# Patient Record
Sex: Male | Born: 1955 | Race: Black or African American | Hispanic: No | Marital: Married | Smoking: Former smoker
Health system: Southern US, Community
[De-identification: ages and names within clinical notes are randomized; demographics above are authoritative.]

## PROBLEM LIST (undated history)

## (undated) DIAGNOSIS — N400 Enlarged prostate without lower urinary tract symptoms: Secondary | ICD-10-CM

## (undated) DIAGNOSIS — Z789 Other specified health status: Secondary | ICD-10-CM

## (undated) DIAGNOSIS — E785 Hyperlipidemia, unspecified: Secondary | ICD-10-CM

## (undated) DIAGNOSIS — M109 Gout, unspecified: Secondary | ICD-10-CM

## (undated) HISTORY — DX: Benign prostatic hyperplasia without lower urinary tract symptoms: N40.0

## (undated) HISTORY — DX: Gout, unspecified: M10.9

## (undated) HISTORY — DX: Hyperlipidemia, unspecified: E78.5

## (undated) HISTORY — PX: CIRCUMCISION: SUR203

---

## 2001-06-29 ENCOUNTER — Encounter: Payer: Self-pay | Admitting: *Deleted

## 2001-06-29 ENCOUNTER — Emergency Department (HOSPITAL_COMMUNITY): Admission: EM | Admit: 2001-06-29 | Discharge: 2001-06-29 | Payer: Self-pay | Admitting: *Deleted

## 2002-10-03 ENCOUNTER — Encounter: Payer: Self-pay | Admitting: Family Medicine

## 2002-10-03 ENCOUNTER — Ambulatory Visit (HOSPITAL_COMMUNITY): Admission: RE | Admit: 2002-10-03 | Discharge: 2002-10-03 | Payer: Self-pay | Admitting: Family Medicine

## 2006-05-25 ENCOUNTER — Ambulatory Visit (HOSPITAL_COMMUNITY): Admission: RE | Admit: 2006-05-25 | Discharge: 2006-05-25 | Payer: Self-pay | Admitting: Family Medicine

## 2006-06-07 ENCOUNTER — Encounter (INDEPENDENT_AMBULATORY_CARE_PROVIDER_SITE_OTHER): Payer: Self-pay | Admitting: Specialist

## 2006-06-07 ENCOUNTER — Ambulatory Visit: Payer: Self-pay | Admitting: Internal Medicine

## 2006-06-07 ENCOUNTER — Ambulatory Visit (HOSPITAL_COMMUNITY): Admission: RE | Admit: 2006-06-07 | Discharge: 2006-06-07 | Payer: Self-pay | Admitting: Internal Medicine

## 2007-11-21 ENCOUNTER — Emergency Department (HOSPITAL_COMMUNITY): Admission: EM | Admit: 2007-11-21 | Discharge: 2007-11-21 | Payer: Self-pay | Admitting: Emergency Medicine

## 2009-01-14 ENCOUNTER — Emergency Department (HOSPITAL_COMMUNITY): Admission: EM | Admit: 2009-01-14 | Discharge: 2009-01-14 | Payer: Self-pay | Admitting: Emergency Medicine

## 2009-07-17 ENCOUNTER — Emergency Department (HOSPITAL_COMMUNITY): Admission: EM | Admit: 2009-07-17 | Discharge: 2009-07-17 | Payer: Self-pay | Admitting: Emergency Medicine

## 2009-07-17 ENCOUNTER — Ambulatory Visit: Payer: Self-pay | Admitting: Orthopedic Surgery

## 2009-07-17 DIAGNOSIS — M7020 Olecranon bursitis, unspecified elbow: Secondary | ICD-10-CM

## 2009-08-05 ENCOUNTER — Ambulatory Visit: Payer: Self-pay | Admitting: Orthopedic Surgery

## 2010-03-12 IMAGING — CR DG SHOULDER 2+V*L*
3 series · 3 of 3 positions shown · non-contrast
Comparison: None

CLINICAL DATA: Pain

LEFT SHOULDER - 2+ VIEW

[view not recorded (1 of 3)]
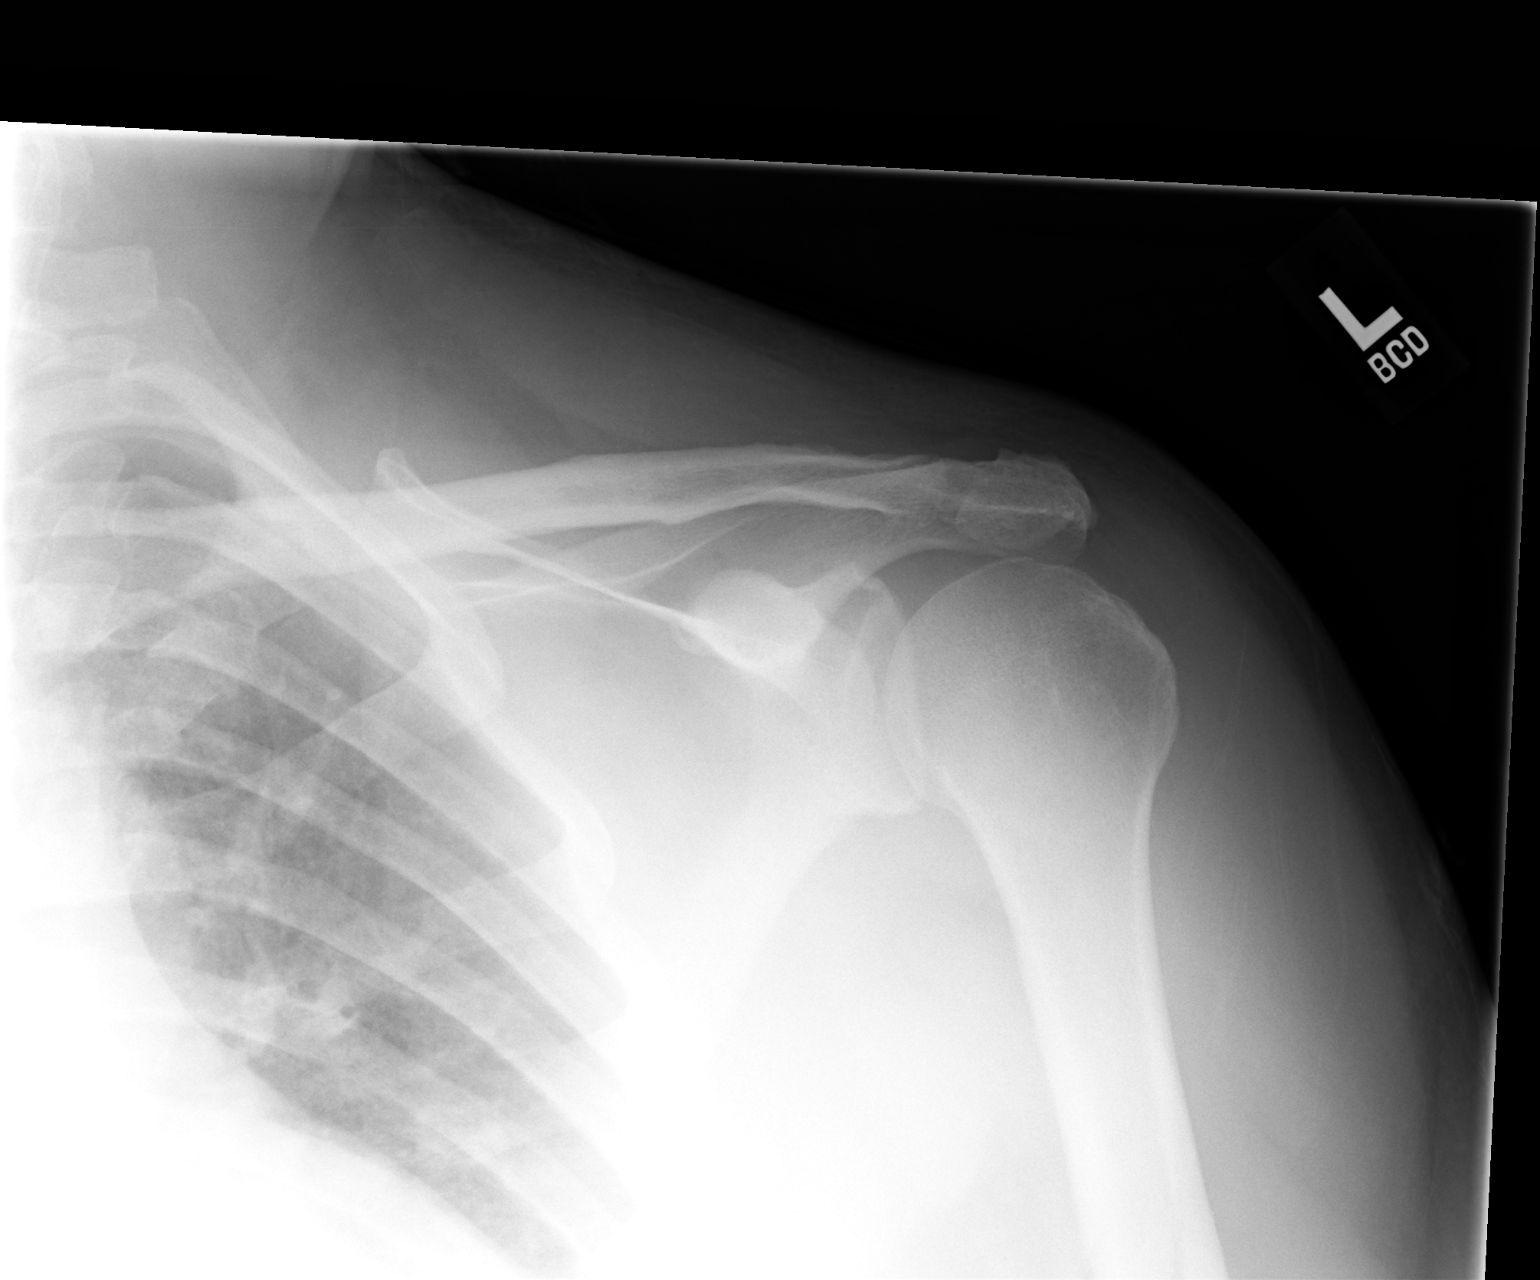

[view not recorded (2 of 3)]
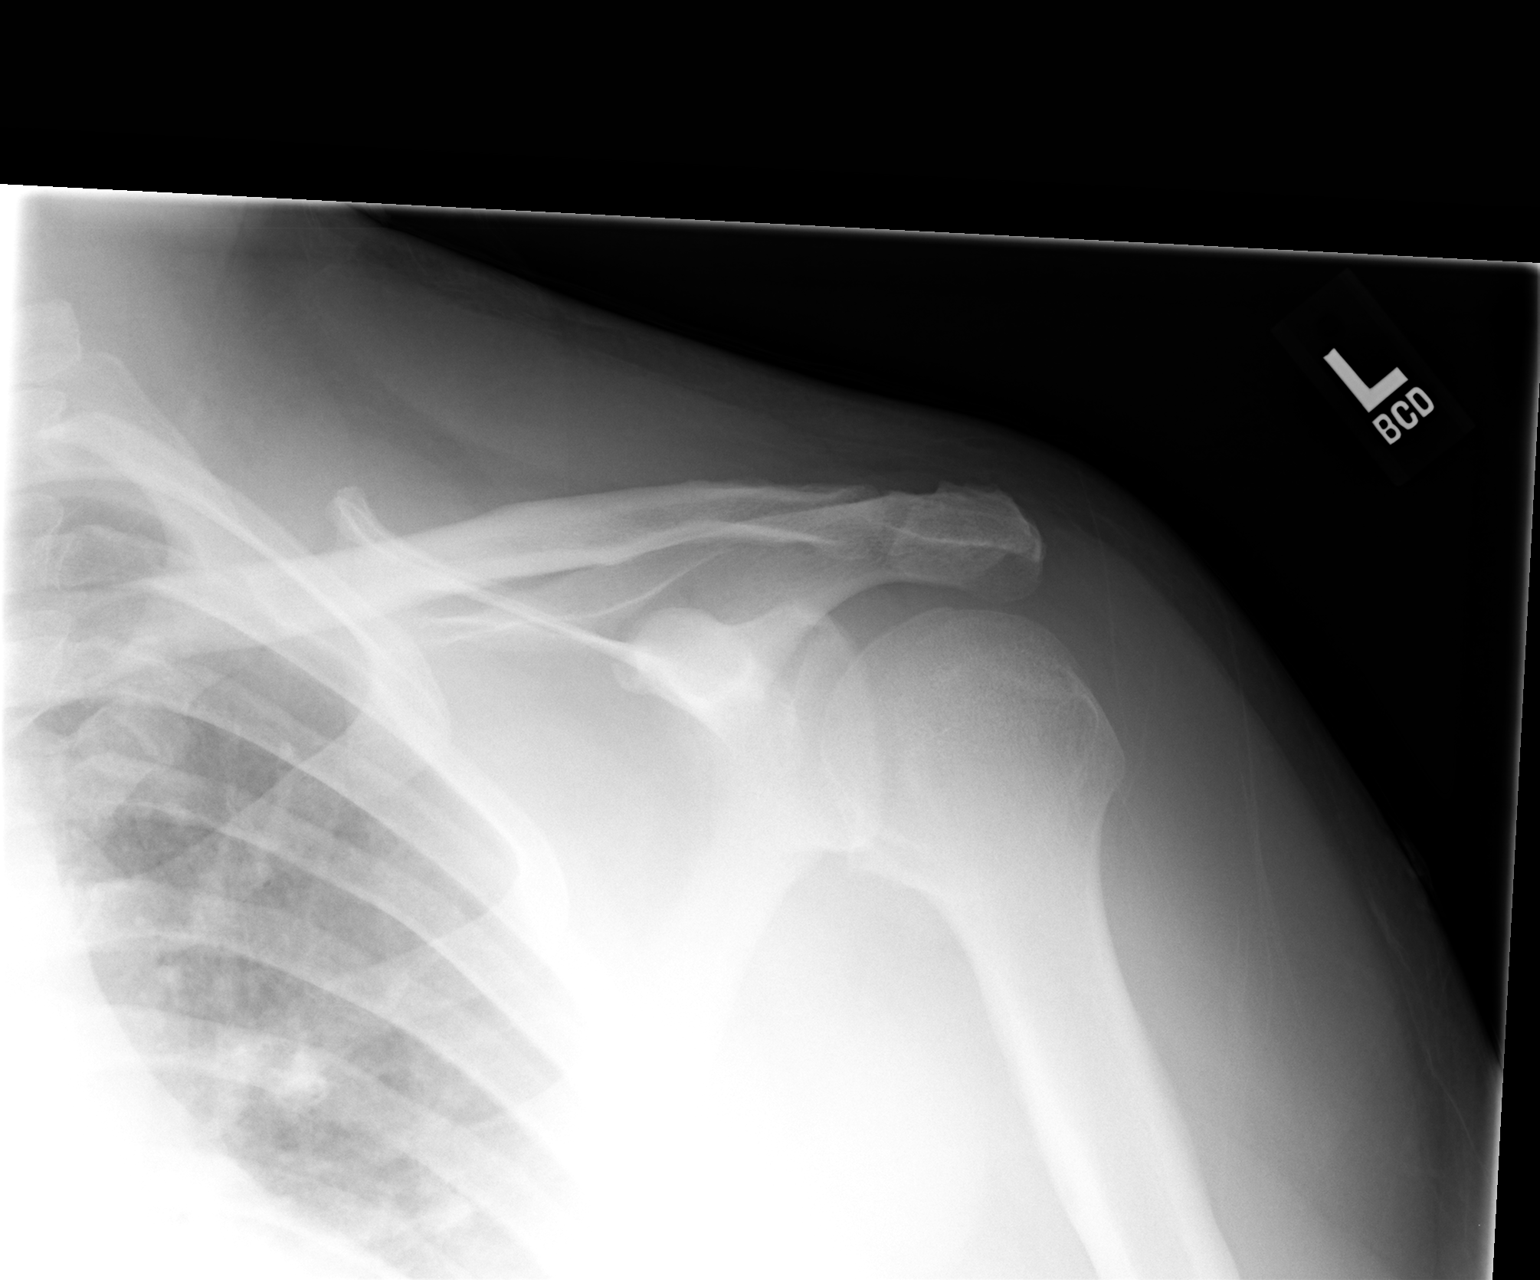

[view not recorded (3 of 3)]
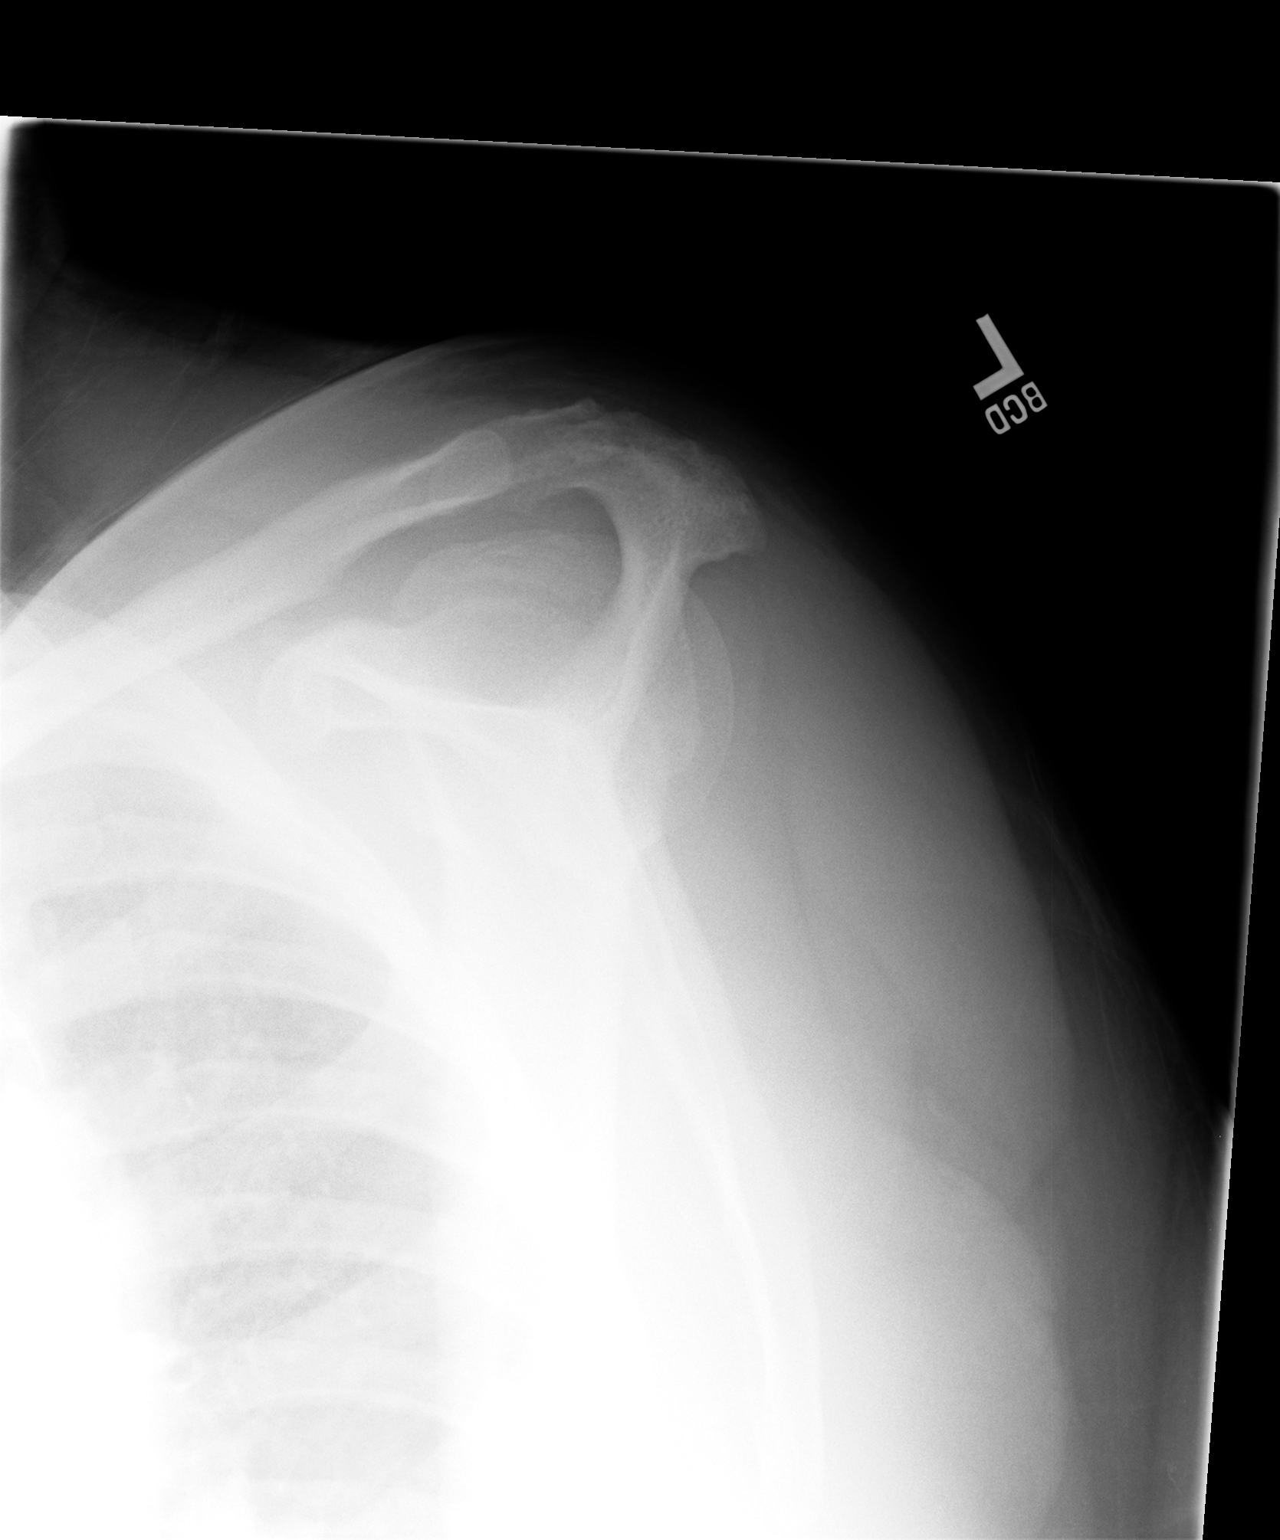

[3 of 3 positions shown; findings below may reference images not displayed]

FINDINGS: There is no evidence of fracture or dislocation.  There
is no evidence of arthropathy or other focal bone abnormality.
Soft tissues are unremarkable.
IMPRESSION: No acute findings.

## 2010-04-26 ENCOUNTER — Emergency Department (HOSPITAL_COMMUNITY): Admission: EM | Admit: 2010-04-26 | Discharge: 2010-04-26 | Payer: Self-pay | Admitting: Emergency Medicine

## 2011-01-15 LAB — POCT I-STAT, CHEM 8
Calcium, Ion: 1.11 mmol/L — ABNORMAL LOW (ref 1.12–1.32)
Chloride: 105 mEq/L (ref 96–112)
Glucose, Bld: 128 mg/dL — ABNORMAL HIGH (ref 70–99)
HCT: 48 % (ref 39.0–52.0)
Hemoglobin: 16.3 g/dL (ref 13.0–17.0)

## 2011-01-15 LAB — POCT CARDIAC MARKERS
CKMB, poc: 3.7 ng/mL (ref 1.0–8.0)
Myoglobin, poc: 168 ng/mL (ref 12–200)

## 2011-02-27 NOTE — Op Note (Signed)
NAME:  Larry Fleming, Larry Fleming                 ACCOUNT NO.:  [id number]   MEDICAL RECORD NO.:  [id number]          PATIENT TYPE:  AMB   LOCATION:  DAY                           FACILITY:  [facility]   PHYSICIAN:  R. Roetta Sessions, M.D. DATE OF BIRTH:  [birth year]   DATE OF PROCEDURE:  06/07/2006  DATE OF DISCHARGE:                                 OPERATIVE REPORT   PROCEDURE:  Colonoscopy with cold biopsy and polyp removal.   INDICATIONS:  The patient is a 55 year old gentleman sent over for  susceptibility for colorectal cancer screening.  He is devoid of any urinary  tract symptoms.  He has never had his lower GI tract imaged.  Family history  is positive for colorectal cancer in his brother who was diagnosed at age  48.  Colonoscopy at this time being done is a high risk screening maneuver.  This approach has been discussed with the patient at length.  Potential  risks, benefits, and alternatives have been reviewed and questions answered.  Please see documentation in the medical record.   DESCRIPTION OF PROCEDURE:  Oxygen saturation, blood pressure, pulse and  respiration were monitored throughout the entire procedure.  Conscious  sedation with Versed 3 mg IV and Demerol 75 mg IV in divided doses.  The  instrument was the Olympus video chip system.   FINDINGS:  Digital rectal exam revealed no abnormalities.   ENDOSCOPIC FINDINGS:  Prep was good.   Colon:  Colonic mucosa was surveyed from the rectosigmoid junction through  the left, transverse,  right colon to the area of the appendiceal orifice,  ileocecal valve and cecum.  These structures were well seen and  photographed.  From this level, the scope was slowly withdrawn.  All  previously mentioned mucosal surfaces were again seen.  The patient had a 3  mm polyp at the cecum which was cold biopsied/removed.  The remainder of the  colonic mucosa appeared normal.  The rectal mucosa was well seen. A thorough  examination of the rectal mucosa  including retroflexed view in the anal  verge revealed no abnormalities.  The patient tolerated the procedure well  and was reactive after endoscopy.   IMPRESSION:  1. Normal rectum.  2. Diminutive polyp at the cecum, cold biopsied/removed.  3. The remainder of the colonic mucosa appeared normal.   RECOMMENDATIONS:  1. Followup on pathology.  2. Further recommendations to follow.      Jonathon Bellows, M.D.  Electronically Signed     RMR/MEDQ  D:  06/07/2006  T:  06/07/2006  Job:  [id number]   cc:   Kirk Ruths, M.D.  Fax: [phone]

## 2013-07-12 ENCOUNTER — Ambulatory Visit (INDEPENDENT_AMBULATORY_CARE_PROVIDER_SITE_OTHER): Payer: Managed Care, Other (non HMO) | Admitting: Family Medicine

## 2013-07-12 ENCOUNTER — Encounter: Payer: Self-pay | Admitting: Family Medicine

## 2013-07-12 VITALS — BP 128/70 | HR 78 | Temp 97.1°F | Resp 14 | Ht 69.0 in | Wt 256.0 lb

## 2013-07-12 DIAGNOSIS — Z0000 Encounter for general adult medical examination without abnormal findings: Secondary | ICD-10-CM

## 2013-07-12 DIAGNOSIS — N529 Male erectile dysfunction, unspecified: Secondary | ICD-10-CM

## 2013-07-12 DIAGNOSIS — Z13220 Encounter for screening for lipoid disorders: Secondary | ICD-10-CM

## 2013-07-12 DIAGNOSIS — N4889 Other specified disorders of penis: Secondary | ICD-10-CM

## 2013-07-12 DIAGNOSIS — R300 Dysuria: Secondary | ICD-10-CM

## 2013-07-12 DIAGNOSIS — R7301 Impaired fasting glucose: Secondary | ICD-10-CM

## 2013-07-12 DIAGNOSIS — E669 Obesity, unspecified: Secondary | ICD-10-CM

## 2013-07-12 DIAGNOSIS — N400 Enlarged prostate without lower urinary tract symptoms: Secondary | ICD-10-CM

## 2013-07-12 DIAGNOSIS — B353 Tinea pedis: Secondary | ICD-10-CM

## 2013-07-12 DIAGNOSIS — Z125 Encounter for screening for malignant neoplasm of prostate: Secondary | ICD-10-CM

## 2013-07-12 LAB — CBC WITH DIFFERENTIAL/PLATELET
Eosinophils Absolute: 0.2 10*3/uL (ref 0.0–0.7)
Eosinophils Relative: 2 % (ref 0–5)
HCT: 45.4 % (ref 39.0–52.0)
Hemoglobin: 15.4 g/dL (ref 13.0–17.0)
Lymphs Abs: 2 10*3/uL (ref 0.7–4.0)
MCH: 25.8 pg — ABNORMAL LOW (ref 26.0–34.0)
MCV: 76 fL — ABNORMAL LOW (ref 78.0–100.0)
Monocytes Absolute: 0.8 10*3/uL (ref 0.1–1.0)
Monocytes Relative: 8 % (ref 3–12)
RBC: 5.97 MIL/uL — ABNORMAL HIGH (ref 4.22–5.81)

## 2013-07-12 LAB — URINALYSIS, ROUTINE W REFLEX MICROSCOPIC
Bilirubin Urine: NEGATIVE
Protein, ur: NEGATIVE mg/dL
Urobilinogen, UA: 0.2 mg/dL (ref 0.0–1.0)

## 2013-07-12 LAB — URINALYSIS, MICROSCOPIC ONLY
Casts: NONE SEEN
Crystals: NONE SEEN

## 2013-07-12 MED ORDER — CLOTRIMAZOLE 1 % EX CREA: TOPICAL_CREAM | Freq: Two times a day (BID) | CUTANEOUS | Status: DC

## 2013-07-12 MED ORDER — TAMSULOSIN HCL 0.4 MG PO CAPS: 0.4000 mg | ORAL_CAPSULE | Freq: Every day | ORAL | Status: DC

## 2013-07-12 NOTE — Assessment & Plan Note (Signed)
Urinalysis shows mild hematuria with only a few RBC. I think is due to some irritation around the urethral area and to to the DRE. We will send off a culture as he is on the study drug and I will fax that to the attention of Alinda Sierras [phone] He did have some sperm at his urinalysis was taken after his prostate exam

## 2013-07-12 NOTE — Assessment & Plan Note (Signed)
Problem Flomax will also check PSA and testosterone

## 2013-07-12 NOTE — Assessment & Plan Note (Signed)
We'll check testosterone level. He may be a good candidate for Viagra or Cialis

## 2013-07-12 NOTE — Assessment & Plan Note (Signed)
Topical clotrimazole 

## 2013-07-12 NOTE — Progress Notes (Signed)
  Subjective:    Patient ID: Larry Fleming, male    DOB: 09/23/1956, 57 y.o.   MRN: [id number]  HPI  Patient here to establish care and for complete physical. Previous PCP [facility]. He is currently in a trial for gout. The study is been completed in [facility]. The study drug is called Lesinurad it is a blind study. He is also on allopurinol 300 mg daily. He was evaluated today secondary to some dysuria and penile irritation while on the medication. A message was sent for him to have a urinalysis done at our office to look for infection.  Elevated cholesterol-he's had a few elevated cholesterol numbers on his study drug labs which he has with him today. However at the last in September show a cholesterol of 171 and triglycerides 239 which is within their normal limits of their labs. I do not see an LDL or HDL. He does have an elevated fasting glucose of 109 his renal function was slightly elevated at 1.24 on the last set of labs.   His concerns today are difficulty with his urinary stream and some nocturia for the past couple months. He often will have to get up 3-4 times a night and has a very weak stream or he dribbles. He also has experienced impotence as well.  TDAP 3 years ago Colonoscopy UTD had at [facility] Review of Systems  GEN- denies fatigue, fever, weight loss,weakness, recent illness HEENT- denies eye drainage, change in vision, nasal discharge, CVS- denies chest pain, palpitations RESP- denies SOB, cough, wheeze ABD- denies N/V, change in stools, abd pain GU- denies dysuria, hematuria, +dribbling, incontinence MSK- denies joint pain, muscle aches, injury Neuro- denies headache, dizziness, syncope, seizure activity      Objective:   Physical Exam GEN- NAD, alert and oriented x3 HEENT- PERRL, EOMI, non injected sclera, pink conjunctiva, MMM, oropharynx clear Neck- Supple, no LAD CVS- RRR, no murmur RESP-CTAB ABD-NABS,soft,NT,ND, no CVA tenderness GU-  coronal urethra,+erythema, no discharge, no blood seen,NT  Rectum- normal tone, Soft brown stool, enlarged prostate no nodules felt EXT- No edema Pulses- Radial, DP- 2+ Psych - normal affect and mood Skin- maceration between most toes, mild erythema, sweaty odor       Assessment & Plan:  CPE- new patient CPE, reviewed labs, will return for fasting Lipid profile  Check PSA- discussed risk factors, Flu shot given

## 2013-07-12 NOTE — Patient Instructions (Addendum)
Use cream for feet as directed- atheletes foot I will send records of the urine to [facility] Flu shot in We will call with other lab results - and further instructions Release of information from [facility] Work on the weight ,limit fried food and fast food, no soda  Flomax for BPH F/U Monday with Lab- fasting  F/U 6 months

## 2013-07-13 ENCOUNTER — Encounter: Payer: Self-pay | Admitting: Family Medicine

## 2013-07-13 ENCOUNTER — Telehealth: Payer: Self-pay | Admitting: Family Medicine

## 2013-07-13 LAB — BASIC METABOLIC PANEL
BUN: 19 mg/dL (ref 6–23)
Chloride: 102 mEq/L (ref 96–112)
Creat: 1.23 mg/dL (ref 0.50–1.35)

## 2013-07-13 LAB — URINE CULTURE: Colony Count: NO GROWTH

## 2013-07-13 NOTE — Telephone Encounter (Signed)
Sent fax over to [facility] medical records for request of colonoscopy report, Trish for MR said that the copy service will look over them adn send report by mail.

## 2013-07-17 ENCOUNTER — Other Ambulatory Visit: Payer: Managed Care, Other (non HMO)

## 2013-07-17 DIAGNOSIS — Z13220 Encounter for screening for lipoid disorders: Secondary | ICD-10-CM

## 2013-07-17 LAB — LIPID PANEL: Cholesterol: 184 mg/dL (ref 0–200)

## 2013-07-24 ENCOUNTER — Ambulatory Visit (INDEPENDENT_AMBULATORY_CARE_PROVIDER_SITE_OTHER): Payer: Managed Care, Other (non HMO) | Admitting: Family Medicine

## 2013-07-24 ENCOUNTER — Encounter: Payer: Self-pay | Admitting: Family Medicine

## 2013-07-24 VITALS — BP 126/70 | HR 72 | Temp 98.1°F | Resp 18 | Ht 68.5 in | Wt 257.0 lb

## 2013-07-24 DIAGNOSIS — E782 Mixed hyperlipidemia: Secondary | ICD-10-CM

## 2013-07-24 DIAGNOSIS — N529 Male erectile dysfunction, unspecified: Secondary | ICD-10-CM

## 2013-07-24 DIAGNOSIS — E291 Testicular hypofunction: Secondary | ICD-10-CM

## 2013-07-24 DIAGNOSIS — R7989 Other specified abnormal findings of blood chemistry: Secondary | ICD-10-CM | POA: Insufficient documentation

## 2013-07-24 MED ORDER — TESTOSTERONE 20.25 MG/ACT (1.62%) TD GEL: 40.5000 mg | Freq: Every day | TRANSDERMAL | Status: DC

## 2013-07-24 NOTE — Progress Notes (Signed)
  Subjective:    Patient ID: Larry Fleming, male    DOB: 1956/04/24, 57 y.o.   MRN: [id number]  HPI  Pt here to f/u labs. Seen for establishing visit. Labs reviewed FLP- elevated TG 198, LDL elevated 121 ED- PSA normal, testosterone low at 224, difficulty getting and maintaining erections,decreased libido. Note penile irritation has cleared up with lotrimin cream CBC/BMET unremarkable, A1C normal   Review of Systems - per above     Objective:   Physical Exam  GEN-NAD,alert and oriented x 3      Assessment & Plan:

## 2013-07-24 NOTE — Assessment & Plan Note (Signed)
Discussed dietary changes needed, recommend he start fish oil 1 tablet BID

## 2013-07-24 NOTE — Patient Instructions (Signed)
Get fish oil ( Omega 3)  1 tablet over the counter  Androgel for hormone therapy Decreased fried food, potatoes , bread, increase water  Change f/u to 3 months

## 2013-07-24 NOTE — Assessment & Plan Note (Signed)
2/2 low testosterone

## 2013-07-24 NOTE — Assessment & Plan Note (Signed)
Discussed pros and cons of testosterone therapy Pt wishes to proceed with androgel Will start low dose and titrate

## 2013-10-24 ENCOUNTER — Ambulatory Visit (INDEPENDENT_AMBULATORY_CARE_PROVIDER_SITE_OTHER): Payer: Managed Care, Other (non HMO) | Admitting: Family Medicine

## 2013-10-24 ENCOUNTER — Encounter: Payer: Self-pay | Admitting: Family Medicine

## 2013-10-24 VITALS — BP 122/76 | HR 78 | Temp 98.1°F | Resp 16 | Ht 69.0 in | Wt 246.0 lb

## 2013-10-24 DIAGNOSIS — E291 Testicular hypofunction: Secondary | ICD-10-CM

## 2013-10-24 DIAGNOSIS — N400 Enlarged prostate without lower urinary tract symptoms: Secondary | ICD-10-CM

## 2013-10-24 DIAGNOSIS — R7989 Other specified abnormal findings of blood chemistry: Secondary | ICD-10-CM

## 2013-10-24 DIAGNOSIS — E782 Mixed hyperlipidemia: Secondary | ICD-10-CM

## 2013-10-24 DIAGNOSIS — E669 Obesity, unspecified: Secondary | ICD-10-CM

## 2013-10-24 LAB — BASIC METABOLIC PANEL
BUN: 13 mg/dL (ref 6–23)
CALCIUM: 9.1 mg/dL (ref 8.4–10.5)
CO2: 22 meq/L (ref 19–32)
Chloride: 107 mEq/L (ref 96–112)
Creat: 1.36 mg/dL — ABNORMAL HIGH (ref 0.50–1.35)
Glucose, Bld: 111 mg/dL — ABNORMAL HIGH (ref 70–99)
POTASSIUM: 4.4 meq/L (ref 3.5–5.3)
SODIUM: 138 meq/L (ref 135–145)

## 2013-10-24 LAB — LIPID PANEL
CHOL/HDL RATIO: 5.3 ratio
Cholesterol: 164 mg/dL (ref 0–200)
HDL: 31 mg/dL — ABNORMAL LOW (ref >39)
LDL Cholesterol: 104 mg/dL — ABNORMAL HIGH (ref 0–99)
Triglycerides: 144 mg/dL (ref <150)
VLDL: 29 mg/dL (ref 0–40)

## 2013-10-24 LAB — TESTOSTERONE: TESTOSTERONE: 189 ng/dL — AB (ref 300–890)

## 2013-10-24 NOTE — Progress Notes (Signed)
   Subjective:    Patient ID: Larry Fleming, male    DOB: Nov 24, 1955, 58 y.o.   MRN: [id number]  HPI  Patient follow up chronic medical problems. He has no specific concerns. He is still in a study-but is now on a new drug. His libido has improved with the use of testosterone. He does still have some difficulty with erections but it is improved. He has changed his diet and has lost 10 pounds since her last visit here. Review of Systems GEN- denies fatigue, fever, weight loss,weakness, recent illness HEENT- denies eye drainage, change in vision, nasal discharge, CVS- denies chest pain, palpitations RESP- denies SOB, cough, wheeze ABD- denies N/V, change in stools, abd pain GU- denies dysuria, hematuria, dribbling, incontinence MSK- denies joint pain, muscle aches, injury Neuro- denies headache, dizziness, syncope, seizure activity       Objective:   Physical Exam GEN- NAD, alert and oriented x3 HEENT- PERRL, EOMI, non injected sclera, pink conjunctiva, MMM, oropharynx clear CVS- RRR, no murmur RESP-CTAB EXT- No edema Pulses- Radial 2+        Assessment & Plan:

## 2013-10-24 NOTE — Assessment & Plan Note (Signed)
improved

## 2013-10-24 NOTE — Patient Instructions (Addendum)
Release  Of records [facility]- [address]- need last OV  We will send letter with normal labs Continue current meds F/U 4 months

## 2013-10-24 NOTE — Assessment & Plan Note (Signed)
Check Testosterone levels  Continue therapy

## 2013-10-24 NOTE — Assessment & Plan Note (Signed)
10 lb weight loss noted.  

## 2013-10-24 NOTE — Assessment & Plan Note (Signed)
Recheck FLP today, weight loss and dietary changes noted

## 2013-12-27 ENCOUNTER — Other Ambulatory Visit: Payer: Self-pay | Admitting: Family Medicine

## 2013-12-28 NOTE — Telephone Encounter (Signed)
Refill appropriate and filled per protocol. 

## 2014-01-10 ENCOUNTER — Encounter: Payer: Self-pay | Admitting: Family Medicine

## 2014-01-10 ENCOUNTER — Ambulatory Visit (INDEPENDENT_AMBULATORY_CARE_PROVIDER_SITE_OTHER): Payer: Managed Care, Other (non HMO) | Admitting: Family Medicine

## 2014-01-10 VITALS — BP 138/72 | HR 64 | Temp 97.5°F | Resp 16 | Ht 69.0 in | Wt 241.0 lb

## 2014-01-10 DIAGNOSIS — M1A00X1 Idiopathic chronic gout, unspecified site, with tophus (tophi): Secondary | ICD-10-CM

## 2014-01-10 DIAGNOSIS — N4889 Other specified disorders of penis: Secondary | ICD-10-CM

## 2014-01-10 DIAGNOSIS — M1A9XX1 Chronic gout, unspecified, with tophus (tophi): Secondary | ICD-10-CM | POA: Insufficient documentation

## 2014-01-10 DIAGNOSIS — N400 Enlarged prostate without lower urinary tract symptoms: Secondary | ICD-10-CM

## 2014-01-10 MED ORDER — DOXAZOSIN MESYLATE ER 4 MG PO TB24: 4.0000 mg | ORAL_TABLET | Freq: Every day | ORAL | Status: DC

## 2014-01-10 MED ORDER — CLOTRIMAZOLE-BETAMETHASONE 1-0.05 % EX CREA: 1.0000 "application " | TOPICAL_CREAM | Freq: Two times a day (BID) | CUTANEOUS | Status: DC

## 2014-01-10 MED ORDER — ALLOPURINOL 300 MG PO TABS: 300.0000 mg | ORAL_TABLET | Freq: Every day | ORAL | Status: DC

## 2014-01-10 NOTE — Assessment & Plan Note (Signed)
Try him on Cardura instead and see if he does better with this in the Flomax

## 2014-01-10 NOTE — Progress Notes (Signed)
Patient ID: Larry Fleming, male   DOB: [birth year], 58 y.o.   MRN: [id number]   Subjective:    Patient ID: Larry Fleming, male    DOB: [birth year], 58 y.o.   MRN: [id number]  Patient presents for 6 month F/U and L elbow pain/ swelling  patient here follow chronic medical problems. He is no longer in the gout trial. He does complain of swelling on his left elbow which occurred a few weeks ago the swelling is now gone down to was also red and tender to touch. He's been off his allopurinol for about 6 weeks as he was no longer getting this from the trial. He does have culture seen at home but did not take it. He states he has never had a gout flare in his elbow before.  Continues to use testosterone for erectile dysfunction and low testosterone. He also has BPH and the Flomax helps some but he still has difficulty urinating.  Also continues to have irritation on the outside of his penis he states he's pulling back the skin and cleaning as normal the redness is now gone but the skin is now cracked and itching.    Review Of Systems:  GEN- denies fatigue, fever, weight loss,weakness, recent illness HEENT- denies eye drainage, change in vision, nasal discharge, CVS- denies chest pain, palpitations RESP- denies SOB, cough, wheeze ABD- denies N/V, change in stools, abd pain GU- denies dysuria, hematuria, +dribbling, incontinence MSK- denies joint pain, muscle aches, injury Neuro- denies headache, dizziness, syncope, seizure activity       Objective:    BP 138/72  Pulse 64  Temp(Src) 97.5 F (36.4 C) (Oral)  Resp 16  Ht 5\' 9"  (1.753 m)  Wt 241 lb (109.317 kg)  BMI 35.57 kg/m2 GEN- NAD, alert and oriented x3 HEENT- PERRL, EOMI, non injected sclera, pink conjunctiva, MMM, oropharynx clear CVS- RRR, no murmur RESP-CTAB ABD-NABS,soft,NT,ND, no CVA tenderness GU- coronal urethra,+mild erythema,crack in skin noted, hypopigmentation around head of penis,no discharge, no blood seen,NT  EXT- No  edema Pulses- Radial 2+          Assessment & Plan:      Problem List Items Addressed This Visit   None      Note: This dictation was prepared with Dragon dictation along with smaller phrase technology. Any transcriptional errors that result from this process are unintentional.

## 2014-01-10 NOTE — Patient Instructions (Signed)
Restart allopurinol Stop the flomax Start cardura once a day for your prostate Call if you have another flare of the gout  CHANGE FOLLOW-UP TO 3 Months

## 2014-01-10 NOTE — Assessment & Plan Note (Signed)
Trial of Lotrisone if this does not improve it I will send him to dermatology or urology

## 2014-01-10 NOTE — Assessment & Plan Note (Signed)
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 I will get him back on his allopurinol will hold on the colchicine at this time. I advised him to keep a hold of this as we may need to be a bit of a flare

## 2014-02-01 ENCOUNTER — Other Ambulatory Visit: Payer: Self-pay | Admitting: Family Medicine

## 2014-02-02 NOTE — Telephone Encounter (Signed)
Ok to refill??  Last office visit 01/10/2014.  Last refill 07/24/2013.

## 2014-02-02 NOTE — Telephone Encounter (Signed)
Okay to refill, give 3 refills 

## 2014-02-02 NOTE — Telephone Encounter (Signed)
Prescription faxed

## 2014-02-21 ENCOUNTER — Ambulatory Visit (INDEPENDENT_AMBULATORY_CARE_PROVIDER_SITE_OTHER): Payer: Managed Care, Other (non HMO) | Admitting: Family Medicine

## 2014-02-21 ENCOUNTER — Encounter: Payer: Self-pay | Admitting: Family Medicine

## 2014-02-21 VITALS — BP 134/86 | HR 64 | Temp 97.9°F | Resp 16 | Ht 69.0 in | Wt 245.0 lb

## 2014-02-21 DIAGNOSIS — L089 Local infection of the skin and subcutaneous tissue, unspecified: Secondary | ICD-10-CM

## 2014-02-21 DIAGNOSIS — S91319A Laceration without foreign body, unspecified foot, initial encounter: Secondary | ICD-10-CM

## 2014-02-21 DIAGNOSIS — S91309A Unspecified open wound, unspecified foot, initial encounter: Secondary | ICD-10-CM

## 2014-02-21 DIAGNOSIS — N400 Enlarged prostate without lower urinary tract symptoms: Secondary | ICD-10-CM

## 2014-02-21 DIAGNOSIS — L909 Atrophic disorder of skin, unspecified: Secondary | ICD-10-CM

## 2014-02-21 DIAGNOSIS — N289 Disorder of kidney and ureter, unspecified: Secondary | ICD-10-CM

## 2014-02-21 DIAGNOSIS — E291 Testicular hypofunction: Secondary | ICD-10-CM

## 2014-02-21 DIAGNOSIS — L918 Other hypertrophic disorders of the skin: Secondary | ICD-10-CM | POA: Insufficient documentation

## 2014-02-21 DIAGNOSIS — R7989 Other specified abnormal findings of blood chemistry: Secondary | ICD-10-CM

## 2014-02-21 DIAGNOSIS — L919 Hypertrophic disorder of the skin, unspecified: Secondary | ICD-10-CM

## 2014-02-21 LAB — BASIC METABOLIC PANEL WITH GFR
BUN: 13 mg/dL (ref 6–23)
CHLORIDE: 105 meq/L (ref 96–112)
CO2: 21 meq/L (ref 19–32)
Calcium: 8.9 mg/dL (ref 8.4–10.5)
Creat: 1.25 mg/dL (ref 0.50–1.35)
GFR, EST NON AFRICAN AMERICAN: 63 mL/min
GFR, Est African American: 73 mL/min
Glucose, Bld: 101 mg/dL — ABNORMAL HIGH (ref 70–99)
POTASSIUM: 4.5 meq/L (ref 3.5–5.3)
Sodium: 136 mEq/L (ref 135–145)

## 2014-02-21 LAB — TESTOSTERONE: Testosterone: 172 ng/dL — ABNORMAL LOW (ref 300–890)

## 2014-02-21 MED ORDER — TERAZOSIN HCL 2 MG PO CAPS: 2.0000 mg | ORAL_CAPSULE | Freq: Every day | ORAL | Status: DC

## 2014-02-21 NOTE — Assessment & Plan Note (Signed)
Note medication is too expensive we'll switch him to terazosin

## 2014-02-21 NOTE — Assessment & Plan Note (Signed)
Recheck renal function with GFR

## 2014-02-21 NOTE — Assessment & Plan Note (Signed)
Status post removal of benign lesion, this was not sent to pathology

## 2014-02-21 NOTE — Patient Instructions (Signed)
New prostate medication sent  We will call with labs  Keep the bandage on for 24 hours Triple antibiotic ointment to both the skin tag and foot CHANGE FOLLOW-UP FROM July TO September

## 2014-02-21 NOTE — Progress Notes (Signed)
Patient ID: Larry Fleming, male   DOB: 01/20/1956, 58 y.o.   MRN: [id number]   Subjective:    Patient ID: Larry Fleming, male    DOB: 01/20/1956, 58 y.o.   MRN: [id number]  Patient presents for F/U and R foot, smallest toe nail bleeding  patient here for interim followup. He complains of his skin tag have on his right side is been irritating him states it is tender to touch and feels like it is always getting caught on things. He would like to have this remove.  3 days ago he was getting out of bed and stump his right little toe he noticed some bleeding afterwards and was concerned that the nail was coming up some. He still has some pain in the toe. He does not have any pain while walking   Review Of Systems:  GEN- denies fatigue, fever, weight loss,weakness, recent illness HEENT- denies eye drainage, change in vision, nasal discharge, CVS- denies chest pain, palpitations RESP- denies SOB, cough, wheeze ABD- denies N/V, change in stools, abd pain GU- denies dysuria, hematuria, dribbling, incontinence MSK- denies joint pain, muscle aches, injury Neuro- denies headache, dizziness, syncope, seizure activity       Objective:    BP 134/86  Pulse 64  Temp(Src) 97.9 F (36.6 C) (Oral)  Resp 16  Ht 5\' 9"  (1.753 m)  Wt 245 lb (111.131 kg)  BMI 36.16 kg/m2 GEN- NAD, alert and oriented x3 Skin- Right mid side- irritated skin tag Ext- Right foot- 5th digit, small laceration above nail, dry blood noted, nail slightly avulsed at tip otherwise connected to nail bed, normal ROM of 5th digit, no swelling noted  Procedure- Skin Tag Removal Procedure explained to patient questions answered benefits and risks discussed verbal consent obtained. Antiseptic-Betadine Anesthesia-lidocaine 1% with epi Tags clipped at base with scissors Minimal blood loss, silver nitrate touched to lesion on neck and axilla due to persistent oozing Patient tolerated procedure well Bandage applied           Assessment & Plan:      Problem List Items Addressed This Visit   Renal insufficiency   Relevant Orders      BASIC METABOLIC PANEL WITH GFR   Low testosterone   Relevant Orders      Testosterone   Laceration of foot - Primary   Inflamed skin tag      Note: This dictation was prepared with Dragon dictation along with smaller phrase technology. Any transcriptional errors that result from this process are unintentional.

## 2014-02-21 NOTE — Assessment & Plan Note (Signed)
Small laceration right above the nail I think this is causing the bleeding. He is a very minimal avulsion therefore I would not try to remove the nail. Triple antibiotic ointment applied after cleaning the bedside. His tetanus booster is up-to-date

## 2014-02-21 NOTE — Assessment & Plan Note (Signed)
-

## 2014-02-23 ENCOUNTER — Telehealth: Payer: Self-pay | Admitting: Family Medicine

## 2014-02-23 DIAGNOSIS — N529 Male erectile dysfunction, unspecified: Secondary | ICD-10-CM

## 2014-02-23 DIAGNOSIS — R7989 Other specified abnormal findings of blood chemistry: Secondary | ICD-10-CM

## 2014-02-23 NOTE — Telephone Encounter (Signed)
Referral initiated.  Have left message for patient to call back.

## 2014-02-23 NOTE — Telephone Encounter (Signed)
Message copied by Donne AnonPLUMMER, Leandria Thier M on Fri Feb 23, 2014 12:50 PM ------      Message from: Milinda AntisURHAM, KAWANTA F      Created: Fri Feb 23, 2014  8:37 AM       Please let pt know kidney function is back to normal      His testosterone however continues to trend to low despite treatment, I recommend he see urology to see other options for treatment              Send Referral- Urology,  Dx low testosterone, ED   ( Fax with last 3 testosterone levels and his PSA from 2014) ------

## 2014-02-26 ENCOUNTER — Other Ambulatory Visit: Payer: Self-pay | Admitting: *Deleted

## 2014-02-26 ENCOUNTER — Encounter: Payer: Self-pay | Admitting: *Deleted

## 2014-02-26 DIAGNOSIS — E291 Testicular hypofunction: Secondary | ICD-10-CM

## 2014-02-27 NOTE — Telephone Encounter (Signed)
Spoke to wife.  They are aware of referral. Appt still pending

## 2014-03-22 ENCOUNTER — Telehealth: Payer: Self-pay | Admitting: *Deleted

## 2014-03-22 NOTE — Telephone Encounter (Signed)
Pt called stating that he needs new Rx sent to pharmacy ANDROGEL PUMP 20.25 MG/ACT (1.62%) GEL he says provider had told him to use 3 pumps instead of 2 and pharmacy says a new script needs to be sent stating 3 pumps.

## 2014-03-23 MED ORDER — TESTOSTERONE 20.25 MG/ACT (1.62%) TD GEL
TRANSDERMAL | Status: DC
Start: 2014-03-23 — End: 2017-06-29

## 2014-03-23 NOTE — Telephone Encounter (Signed)
Send with new dose of 3 pumps, also make sure he has his urology appt for his Low T, referral was sent in May

## 2014-03-23 NOTE — Telephone Encounter (Signed)
Prescription sent to pharmacy.

## 2014-03-23 NOTE — Telephone Encounter (Signed)
Ok to refill with increased dosage??  Last office visit 02/21/2014.  Last refill 02/02/2014.

## 2014-04-11 ENCOUNTER — Ambulatory Visit: Payer: Managed Care, Other (non HMO) | Admitting: Family Medicine

## 2014-04-17 ENCOUNTER — Ambulatory Visit: Payer: Managed Care, Other (non HMO) | Admitting: Family Medicine

## 2014-05-06 ENCOUNTER — Encounter (HOSPITAL_COMMUNITY): Payer: Self-pay | Admitting: Emergency Medicine

## 2014-05-06 ENCOUNTER — Emergency Department (HOSPITAL_COMMUNITY)
Admission: EM | Admit: 2014-05-06 | Discharge: 2014-05-06 | Disposition: A | Discharge status: Home / Self Care | Payer: Managed Care, Other (non HMO) | Attending: Emergency Medicine | Admitting: Emergency Medicine

## 2014-05-06 ENCOUNTER — Emergency Department (HOSPITAL_COMMUNITY): Payer: Managed Care, Other (non HMO)

## 2014-05-06 DIAGNOSIS — S5002XA Contusion of left elbow, initial encounter: Secondary | ICD-10-CM

## 2014-05-06 DIAGNOSIS — Z87891 Personal history of nicotine dependence: Secondary | ICD-10-CM | POA: Insufficient documentation

## 2014-05-06 DIAGNOSIS — S199XXA Unspecified injury of neck, initial encounter: Secondary | ICD-10-CM

## 2014-05-06 DIAGNOSIS — Y92410 Unspecified street and highway as the place of occurrence of the external cause: Secondary | ICD-10-CM | POA: Insufficient documentation

## 2014-05-06 DIAGNOSIS — Z791 Long term (current) use of non-steroidal anti-inflammatories (NSAID): Secondary | ICD-10-CM | POA: Insufficient documentation

## 2014-05-06 DIAGNOSIS — S139XXA Sprain of joints and ligaments of unspecified parts of neck, initial encounter: Secondary | ICD-10-CM | POA: Insufficient documentation

## 2014-05-06 DIAGNOSIS — Y9389 Activity, other specified: Secondary | ICD-10-CM | POA: Insufficient documentation

## 2014-05-06 DIAGNOSIS — Z79899 Other long term (current) drug therapy: Secondary | ICD-10-CM | POA: Insufficient documentation

## 2014-05-06 DIAGNOSIS — Z7982 Long term (current) use of aspirin: Secondary | ICD-10-CM | POA: Insufficient documentation

## 2014-05-06 DIAGNOSIS — M109 Gout, unspecified: Secondary | ICD-10-CM | POA: Insufficient documentation

## 2014-05-06 DIAGNOSIS — S161XXA Strain of muscle, fascia and tendon at neck level, initial encounter: Secondary | ICD-10-CM

## 2014-05-06 DIAGNOSIS — S5000XA Contusion of unspecified elbow, initial encounter: Secondary | ICD-10-CM | POA: Insufficient documentation

## 2014-05-06 DIAGNOSIS — S0993XA Unspecified injury of face, initial encounter: Secondary | ICD-10-CM | POA: Insufficient documentation

## 2014-05-06 MED ORDER — METHOCARBAMOL 500 MG PO TABS: 500.0000 mg | ORAL_TABLET | Freq: Three times a day (TID) | ORAL | Status: DC

## 2014-05-06 MED ORDER — TRAMADOL HCL 50 MG PO TABS: 50.0000 mg | ORAL_TABLET | Freq: Four times a day (QID) | ORAL | Status: DC | PRN
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
## 2014-05-06 NOTE — ED Notes (Signed)
C-collar placed on patient.
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
## 2014-05-06 NOTE — Discharge Instructions (Signed)
Cervical Sprain A cervical sprain is when the tissues (ligaments) that hold the neck bones in place stretch or tear. HOME CARE   Put ice on the injured area.  Put ice in a plastic bag.  Place a towel between your skin and the bag.  Leave the ice on for 15-20 minutes, 3-4 times a day.  You may have been given a collar to wear. This collar keeps your neck from moving while you heal.  Do not take the collar off unless told by your doctor.  If you have Hol[place] hair, keep it outside of the collar.  Ask your doctor before changing the position of your collar. You may need to change its position over time to make it more comfortable.  If you are allowed to take off the collar for cleaning or bathing, follow your doctor's instructions on how to do it safely.  Keep your collar clean by wiping it with mild soap and water. Dry it completely. If the collar has removable pads, remove them every 1-2 days to hand wash them with soap and water. Allow them to air dry. They should be dry before you wear them in the collar.  Do not drive while wearing the collar.  Only take medicine as told by your doctor.  Keep all doctor visits as told.  Keep all physical therapy visits as told.  Adjust your work station so that you have good posture while you work.  Avoid positions and activities that make your problems worse.  Warm up and stretch before being active. GET HELP IF:  Your pain is not controlled with medicine.  You cannot take less pain medicine over time as planned.  Your activity level does not improve as expected. GET HELP RIGHT AWAY IF:   You are bleeding.  Your stomach is upset.  You have an allergic reaction to your medicine.  You develop new problems that you cannot explain.  You lose feeling (become numb) or you cannot move any part of your body (paralysis).  You have tingling or weakness in any part of your body.  Your symptoms get worse. Symptoms include:  Pain,  soreness, stiffness, puffiness (swelling), or a burning feeling in your neck.  Pain when your neck is touched.  Shoulder or upper back pain.  Limited ability to move your neck.  Headache.  Dizziness.  Your hands or arms feel week, lose feeling, or tingle.  Muscle spasms.  Difficulty swallowing or chewing. MAKE SURE YOU:   Understand these instructions.  Will watch your condition.  Will get help right away if you are not doing well or get worse. Document Released: 03/16/2008 Document Revised: 05/31/2013 Document Reviewed: 04/05/2013 [facility] Patient Information 2015 [place], [place]. This information is not intended to replace advice given to you by your health care provider. Make sure you discuss any questions you have with your health care provider.  Contusion A contusion is a deep bruise. Contusions happen when an injury causes bleeding under the skin. Signs of bruising include pain, puffiness (swelling), and discolored skin. The contusion may turn blue, purple, or yellow. HOME CARE   Put ice on the injured area.  Put ice in a plastic bag.  Place a towel between your skin and the bag.  Leave the ice on for 15-20 minutes, 03-04 times a day.  Only take medicine as told by your doctor.  Rest the injured area.  If possible, raise (elevate) the injured area to lessen puffiness. GET HELP RIGHT AWAY IF:  You have more bruising or puffiness.  You have pain that is getting worse.  Your puffiness or pain is not helped by medicine. MAKE SURE YOU:   Understand these instructions.  Will watch your condition.  Will get help right away if you are not doing well or get worse. Document Released: 03/16/2008 Document Revised: 12/21/2011 Document Reviewed: 08/03/2011 [facility] Patient Information 2015 [place], [place]. This information is not intended to replace advice given to you by your health care provider. Make sure you discuss any questions you have with your health care  provider.  Elbow Contusion An elbow contusion is a deep bruise of the elbow. Contusions are the result of an injury that caused bleeding under the skin. The contusion may turn blue, purple, or yellow. Minor injuries will give you a painless contusion, but more severe contusions may stay painful and swollen for a few weeks.  CAUSES  An elbow contusion comes from a direct force to that area, such as falling on the elbow. SYMPTOMS   Swelling and redness of the elbow.  Bruising of the elbow area.  Tenderness or soreness of the elbow. DIAGNOSIS  You will have a physical exam and will be asked about your history. You may need an X-ray of your elbow to look for a broken bone (fracture).  TREATMENT  A sling or splint may be needed to support your injury. Resting, elevating, and applying cold compresses to the elbow area are often the best treatments for an elbow contusion. Over-the-counter medicines may also be recommended for pain control. HOME CARE INSTRUCTIONS   Put ice on the injured area.  Put ice in a plastic bag.  Place a towel between your skin and the bag.  Leave the ice on for 15-20 minutes, 03-04 times a day.  Only take over-the-counter or prescription medicines for pain, discomfort, or fever as directed by your caregiver.  Rest your injured elbow until the pain and swelling are better.  Elevate your elbow to reduce swelling.  Apply a compression wrap as directed by your caregiver. This can help reduce swelling and motion. You may remove the wrap for sleeping, showers, and baths. If your fingers become numb, cold, or blue, take the wrap off and reapply it more loosely.  Use your elbow only as directed by your caregiver. You may be asked to do range of motion exercises. Do them as directed.  See your caregiver as directed. It is very important to keep all follow-up appointments in order to avoid any Sporrer-term problems with your elbow, including chronic pain or inability to move  your elbow normally. SEEK IMMEDIATE MEDICAL CARE IF:   You have increased redness, swelling, or pain in your elbow.  Your swelling or pain is not relieved with medicines.  You have swelling of the hand and fingers.  You are unable to move your fingers or wrist.  You begin to lose feeling in your hand or fingers.  Your fingers or hand become cold or blue. MAKE SURE YOU:   Understand these instructions.  Will watch your condition.  Will get help right away if you are not doing well or get worse. Document Released: 09/06/2006 Document Revised: 12/21/2011 Document Reviewed: 08/14/2011 [facility] Patient Information 2015 [place], [place]. This information is not intended to replace advice given to you by your health care provider. Make sure you discuss any questions you have with your health care provider.

## 2014-05-09 NOTE — ED Provider Notes (Signed)
Medical screening examination/treatment/procedure(s) were performed by non-physician practitioner and as supervising physician I was immediately available for consultation/collaboration.   EKG Interpretation None       Nayef College, MD 05/09/14 1839 

## 2014-06-27 ENCOUNTER — Ambulatory Visit: Payer: Managed Care, Other (non HMO) | Admitting: Family Medicine

## 2014-08-08 ENCOUNTER — Other Ambulatory Visit: Payer: Self-pay | Admitting: Family Medicine

## 2014-08-20 ENCOUNTER — Other Ambulatory Visit: Payer: Self-pay | Admitting: Family Medicine

## 2014-08-21 NOTE — Telephone Encounter (Signed)
Refill denied 

## 2014-10-13 ENCOUNTER — Other Ambulatory Visit: Payer: Self-pay | Admitting: Family Medicine

## 2014-10-15 NOTE — Telephone Encounter (Signed)
Patient is no longer seen at our clinic.   Refill denied.

## 2014-11-01 ENCOUNTER — Other Ambulatory Visit: Payer: Self-pay | Admitting: Family Medicine

## 2014-11-06 ENCOUNTER — Encounter: Payer: Self-pay | Admitting: Family Medicine

## 2014-12-24 ENCOUNTER — Encounter: Payer: Self-pay | Admitting: Family Medicine

## 2015-05-10 ENCOUNTER — Encounter: Payer: Self-pay | Admitting: Family Medicine

## 2015-05-10 ENCOUNTER — Ambulatory Visit (INDEPENDENT_AMBULATORY_CARE_PROVIDER_SITE_OTHER): Payer: Managed Care, Other (non HMO) | Admitting: Family Medicine

## 2015-05-10 VITALS — BP 122/70 | HR 80 | Temp 97.9°F | Resp 18 | Wt 242.0 lb

## 2015-05-10 DIAGNOSIS — M1000 Idiopathic gout, unspecified site: Secondary | ICD-10-CM | POA: Diagnosis not present

## 2015-05-10 MED ORDER — PREDNISONE 20 MG PO TABS: ORAL_TABLET | ORAL | Status: DC

## 2015-05-10 MED ORDER — METHYLPREDNISOLONE ACETATE 80 MG/ML IJ SUSP
80.0000 mg | Freq: Once | INTRAMUSCULAR | Status: AC
  Administered 2015-05-10: 80 mg via INTRAMUSCULAR

## 2015-05-10 NOTE — Addendum Note (Signed)
Addended by: Elvina Mattes T on: 05/10/2015 03:12 PM   Modules accepted: Orders

## 2015-05-10 NOTE — Progress Notes (Signed)
   Subjective:    Patient ID: Larry Fleming, male    DOB: 1956/07/15, 59 y.o.   MRN: [id number]  HPI Patient is a very pleasant 59 year old African-American male who presents with severe pain in his left wrist. The left wrist is erythematous warm and swollen. He has a history of tophaceous gout. Patient quit taking his allopurinol 8 months ago. He reports severe pain with range of motion in the wrist. He denies any injury to the wrist. He denies any cuts or injections in the wrist. Patient states the pain feels similar to his previous gout exacerbations that he's had Past Medical History  Diagnosis Date  . Gout   . Hyperlipidemia    No past surgical history on file. Current Outpatient Prescriptions on File Prior to Visit  Medication Sig Dispense Refill  . allopurinol (ZYLOPRIM) 300 MG tablet Take 1 tablet (300 mg total) by mouth daily. 30 tablet 6  . aspirin EC 81 MG tablet Take 81 mg by mouth daily.    . methocarbamol (ROBAXIN) 500 MG tablet Take 1 tablet (500 mg total) by mouth 3 (three) times daily. 21 tablet 0  . naproxen sodium (ALEVE) 220 MG tablet Take 220 mg by mouth 2 (two) times daily with a meal.    . tamsulosin (FLOMAX) 0.4 MG CAPS capsule Take 0.4 mg by mouth daily.    . Testosterone (ANDROGEL PUMP) 20.25 MG/ACT (1.62%) GEL PLACE 3 PUMPS ONTO THE SKIN EVERY DAY AS DIRECTED 75 g 2  . Testosterone (ANDROGEL PUMP) 20.25 MG/ACT (1.62%) GEL Place 3 Act onto the skin daily.    . traMADol (ULTRAM) 50 MG tablet Take 1 tablet (50 mg total) by mouth every 6 (six) hours as needed. 15 tablet 0   No current facility-administered medications on file prior to visit.   No Known Allergies History   Social History  . Marital Status: Married    Spouse Name: N/A  . Number of Children: N/A  . Years of Education: N/A   Occupational History  . Not on file.   Social History Main Topics  . Smoking status: Former Smoker -- 1.00 packs/day for 4 years    Types: Cigarettes    Quit date:  02/09/1998  . Smokeless tobacco: Never Used  . Alcohol Use: No  . Drug Use: No  . Sexual Activity: Not on file   Other Topics Concern  . Not on file   Social History Narrative      Review of Systems  All other systems reviewed and are negative.      Objective:   Physical Exam  Cardiovascular: Normal rate, regular rhythm and normal heart sounds.   Pulmonary/Chest: Effort normal and breath sounds normal.  Musculoskeletal:       Left wrist: He exhibits decreased range of motion, tenderness, bony tenderness and swelling.  Skin: Skin is warm. There is erythema.  Vitals reviewed.         Assessment & Plan:  Acute idiopathic gout, unspecified site - Plan: predniSONE (DELTASONE) 20 MG tablet  Patient has a severe gout flare. Begin Depo-Medrol 80 mg IM 1. Then start prednisone taper pack tomorrow. If not getting better significantly over the next 24 hours on the patient reassessed as infection is also on the differential however I believe that this is a gout exacerbation. I'll also recommended the patient follow-up with his primary care physician to discuss resuming allopurinol

## 2015-05-22 ENCOUNTER — Encounter: Payer: Self-pay | Admitting: Family Medicine

## 2015-05-22 ENCOUNTER — Ambulatory Visit (INDEPENDENT_AMBULATORY_CARE_PROVIDER_SITE_OTHER): Payer: Managed Care, Other (non HMO) | Admitting: Family Medicine

## 2015-05-22 VITALS — BP 138/84 | HR 64 | Temp 97.9°F | Resp 12 | Ht 68.0 in | Wt 237.0 lb

## 2015-05-22 DIAGNOSIS — H6122 Impacted cerumen, left ear: Secondary | ICD-10-CM | POA: Diagnosis not present

## 2015-05-22 DIAGNOSIS — M109 Gout, unspecified: Secondary | ICD-10-CM

## 2015-05-22 DIAGNOSIS — E782 Mixed hyperlipidemia: Secondary | ICD-10-CM | POA: Diagnosis not present

## 2015-05-22 DIAGNOSIS — Z0000 Encounter for general adult medical examination without abnormal findings: Secondary | ICD-10-CM | POA: Diagnosis not present

## 2015-05-22 DIAGNOSIS — N528 Other male erectile dysfunction: Secondary | ICD-10-CM | POA: Diagnosis not present

## 2015-05-22 DIAGNOSIS — E669 Obesity, unspecified: Secondary | ICD-10-CM | POA: Diagnosis not present

## 2015-05-22 DIAGNOSIS — M1A9XX1 Chronic gout, unspecified, with tophus (tophi): Secondary | ICD-10-CM

## 2015-05-22 LAB — LIPID PANEL
Cholesterol: 154 mg/dL (ref 125–200)
HDL: 31 mg/dL — AB (ref >=40)
LDL Cholesterol: 99 mg/dL (ref <130)
TRIGLYCERIDES: 121 mg/dL (ref <150)
Total CHOL/HDL Ratio: 5 Ratio (ref <=5.0)
VLDL: 24 mg/dL (ref <30)

## 2015-05-22 LAB — COMPREHENSIVE METABOLIC PANEL
ALBUMIN: 3.7 g/dL (ref 3.6–5.1)
ALK PHOS: 58 U/L (ref 40–115)
ALT: 17 U/L (ref 9–46)
AST: 16 U/L (ref 10–35)
BUN: 13 mg/dL (ref 7–25)
CO2: 23 mmol/L (ref 20–31)
Calcium: 9 mg/dL (ref 8.6–10.3)
Chloride: 104 mmol/L (ref 98–110)
Creat: 1.2 mg/dL (ref 0.70–1.33)
GLUCOSE: 108 mg/dL — AB (ref 70–99)
POTASSIUM: 4.3 mmol/L (ref 3.5–5.3)
Sodium: 138 mmol/L (ref 135–146)
TOTAL PROTEIN: 6.2 g/dL (ref 6.1–8.1)
Total Bilirubin: 0.4 mg/dL (ref 0.2–1.2)

## 2015-05-22 LAB — CBC WITH DIFFERENTIAL/PLATELET
BASOS PCT: 0 % (ref 0–1)
Basophils Absolute: 0 10*3/uL (ref 0.0–0.1)
Eosinophils Absolute: 0.2 10*3/uL (ref 0.0–0.7)
Eosinophils Relative: 2 % (ref 0–5)
HCT: 51 % (ref 39.0–52.0)
Hemoglobin: 16.8 g/dL (ref 13.0–17.0)
Lymphocytes Relative: 18 % (ref 12–46)
Lymphs Abs: 1.7 10*3/uL (ref 0.7–4.0)
MCH: 26.3 pg (ref 26.0–34.0)
MCHC: 32.9 g/dL (ref 30.0–36.0)
MCV: 79.9 fL (ref 78.0–100.0)
MONO ABS: 0.9 10*3/uL (ref 0.1–1.0)
MPV: 9.4 fL (ref 8.6–12.4)
Monocytes Relative: 10 % (ref 3–12)
NEUTROS ABS: 6.4 10*3/uL (ref 1.7–7.7)
NEUTROS PCT: 70 % (ref 43–77)
PLATELETS: 281 10*3/uL (ref 150–400)
RBC: 6.38 MIL/uL — ABNORMAL HIGH (ref 4.22–5.81)
RDW: 18.1 % — ABNORMAL HIGH (ref 11.5–15.5)
WBC: 9.2 10*3/uL (ref 4.0–10.5)

## 2015-05-22 LAB — URIC ACID: URIC ACID, SERUM: 9.3 mg/dL — AB (ref 4.0–7.8)

## 2015-05-22 MED ORDER — SILDENAFIL CITRATE 100 MG PO TABS: 50.0000 mg | ORAL_TABLET | Freq: Every day | ORAL | Status: DC | PRN

## 2015-05-22 MED ORDER — ALLOPURINOL 100 MG PO TABS: 200.0000 mg | ORAL_TABLET | Freq: Every day | ORAL | Status: DC

## 2015-05-22 NOTE — Assessment & Plan Note (Signed)
Restart allopurinol, start with  for 4 weeks, then increase to  once a day ,

## 2015-05-22 NOTE — Progress Notes (Signed)
Patient ID: Larry Fleming, male   DOB: 16-Apr-1956, 59 y.o.   MRN: [id number]   Subjective:    Patient ID: Larry Fleming, male    DOB: Mar 02, 1956, 59 y.o.   MRN: [id number]  Patient presents for CPE  patient here to for complete physical exam. He has no particular concerns. He states that he feels well. He would like to restart his allopurinol he ran out of his prescription after he did not follow-up at our office. He was treated for gout flare about 2 weeks ago which that is now resolved. He is still being followed by urology for BPH as well as low testosterone. He has been donating blood echo due to elevated hematocrit which has been working. His colonoscopy is up-to-date. His immunizations are up-to-date. He still works full-time without any difficulties.  Had hearing exam at work, noted to have wax impaction in left ear, needs cleaned out, hearing will be rechecked at work  Review Of Systems:  GEN- denies fatigue, fever, weight loss,weakness, recent illness HEENT- denies eye drainage, change in vision, nasal discharge, CVS- denies chest pain, palpitations RESP- denies SOB, cough, wheeze ABD- denies N/V, change in stools, abd pain GU- denies dysuria, hematuria, dribbling, incontinence MSK- denies joint pain, muscle aches, injury Neuro- denies headache, dizziness, syncope, seizure activity       Objective:    BP 138/84 mmHg  Pulse 64  Temp(Src) 97.9 F (36.6 C) (Oral)  Resp 12  Ht  (1.727 m)  Wt 237 lb (107.502 kg)  BMI 36.04 kg/m2 GEN- NAD, alert and oriented x3 HEENT- PERRL, EOMI, non injected sclera, pink conjunctiva, MMM, oropharynx clear, TM and canal clear right side, impacted canal on left side  Neck- Supple, no thyromegaly CVS- RRR, no murmur RESP-CTAB ABD-NABS,soft,NT,ND Ext- gouty tophi bilat elbows  EXT- No edema Pulses- Radial  2+        Assessment & Plan:      Problem List Items Addressed This Visit    Obesity   Hyperlipidemia, mixed    Check  labs, continue to work on weight loss and healthy eating      Relevant Medications   sildenafil (VIAGRA) 100 MG tablet   Other Relevant Orders   Lipid panel   Gouty tophi of joint    Restart allopurinol, start with  for 4 weeks, then increase to  once a day ,      Relevant Medications   allopurinol (ZYLOPRIM) 100 MG tablet   Other Relevant Orders   Uric Acid   Erectile dysfunction    Given sample of viagra , script sent for  - he can take 1/2 tablet       Other Visit Diagnoses    Routine general medical examination at a health care facility    -  Primary    CPE done, shots UTD, fasting labs, colonoscopy due in 2017    Relevant Orders    CBC with Differential/Platelet    Comprehensive metabolic panel    Cerumen impaction, left        s/p irrigation of ear       Note: This dictation was prepared with Dragon dictation along with smaller phrase technology. Any transcriptional errors that result from this process are unintentional.

## 2015-05-22 NOTE — Patient Instructions (Signed)
Restart allopurinol- start with 1 tablet, for 1 month then move to 2 tablets I recommend eye visit once a year I recommend dental visit every 6 months Goal is to  Exercise 30 minutes 5 days a week We will send a letter with lab results  F/U 6 months

## 2015-05-22 NOTE — Assessment & Plan Note (Signed)
Check labs, continue to work on weight loss and healthy eating

## 2015-05-22 NOTE — Assessment & Plan Note (Signed)
Given sample of viagra , script sent for  - he can take 1/2 tablet

## 2015-11-22 ENCOUNTER — Ambulatory Visit (INDEPENDENT_AMBULATORY_CARE_PROVIDER_SITE_OTHER): Payer: Managed Care, Other (non HMO) | Admitting: Family Medicine

## 2015-11-22 ENCOUNTER — Encounter: Payer: Self-pay | Admitting: Family Medicine

## 2015-11-22 VITALS — BP 138/64 | HR 72 | Temp 99.7°F | Resp 16 | Ht 68.0 in | Wt 246.0 lb

## 2015-11-22 DIAGNOSIS — R7301 Impaired fasting glucose: Secondary | ICD-10-CM

## 2015-11-22 DIAGNOSIS — E669 Obesity, unspecified: Secondary | ICD-10-CM | POA: Diagnosis not present

## 2015-11-22 DIAGNOSIS — M19042 Primary osteoarthritis, left hand: Secondary | ICD-10-CM | POA: Diagnosis not present

## 2015-11-22 DIAGNOSIS — E782 Mixed hyperlipidemia: Secondary | ICD-10-CM | POA: Diagnosis not present

## 2015-11-22 DIAGNOSIS — M1A9XX1 Chronic gout, unspecified, with tophus (tophi): Secondary | ICD-10-CM

## 2015-11-22 DIAGNOSIS — M109 Gout, unspecified: Secondary | ICD-10-CM

## 2015-11-22 DIAGNOSIS — M1812 Unilateral primary osteoarthritis of first carpometacarpal joint, left hand: Secondary | ICD-10-CM

## 2015-11-22 LAB — CBC WITH DIFFERENTIAL/PLATELET
BASOS PCT: 1 % (ref 0–1)
Basophils Absolute: 0.1 10*3/uL (ref 0.0–0.1)
Eosinophils Absolute: 0.2 10*3/uL (ref 0.0–0.7)
Eosinophils Relative: 3 % (ref 0–5)
HCT: 51.3 % (ref 39.0–52.0)
Hemoglobin: 16.6 g/dL (ref 13.0–17.0)
Lymphocytes Relative: 23 % (ref 12–46)
Lymphs Abs: 1.5 10*3/uL (ref 0.7–4.0)
MCH: 25.9 pg — ABNORMAL LOW (ref 26.0–34.0)
MCHC: 32.4 g/dL (ref 30.0–36.0)
MCV: 80.2 fL (ref 78.0–100.0)
MPV: 10.2 fL (ref 8.6–12.4)
Monocytes Absolute: 0.9 10*3/uL (ref 0.1–1.0)
Monocytes Relative: 13 % — ABNORMAL HIGH (ref 3–12)
NEUTROS ABS: 4 10*3/uL (ref 1.7–7.7)
NEUTROS PCT: 60 % (ref 43–77)
PLATELETS: 290 10*3/uL (ref 150–400)
RBC: 6.4 MIL/uL — AB (ref 4.22–5.81)
RDW: 18.3 % — ABNORMAL HIGH (ref 11.5–15.5)
WBC: 6.7 10*3/uL (ref 4.0–10.5)

## 2015-11-22 LAB — BASIC METABOLIC PANEL
BUN: 12 mg/dL (ref 7–25)
CO2: 21 mmol/L (ref 20–31)
Calcium: 9 mg/dL (ref 8.6–10.3)
Chloride: 107 mmol/L (ref 98–110)
Creat: 1.19 mg/dL (ref 0.70–1.25)
Glucose, Bld: 79 mg/dL (ref 70–99)
POTASSIUM: 4.8 mmol/L (ref 3.5–5.3)
SODIUM: 137 mmol/L (ref 135–146)

## 2015-11-22 LAB — LIPID PANEL
CHOL/HDL RATIO: 6.9 ratio — AB (ref <=5.0)
CHOLESTEROL: 172 mg/dL (ref 125–200)
HDL: 25 mg/dL — AB (ref >=40)
LDL Cholesterol: 105 mg/dL (ref <130)
Triglycerides: 208 mg/dL — ABNORMAL HIGH (ref <150)
VLDL: 42 mg/dL — AB (ref <30)

## 2015-11-22 LAB — HEMOGLOBIN A1C
Hgb A1c MFr Bld: 4.7 % (ref <5.7)
MEAN PLASMA GLUCOSE: 88 mg/dL (ref <117)

## 2015-11-22 NOTE — Progress Notes (Signed)
Patient ID: Larry Fleming, male   DOB: Oct 28, 1955, 60 y.o.   MRN: [id number]   Subjective:    Patient ID: Larry Fleming, male    DOB: April 28, 1956, 60 y.o.   MRN: [id number]  Patient presents for 6 month F/U   patient here for follow-up. He is taking his medications as prescribed. He is on allopurinol for gout. Has not had any exacerbations since her last visit. He also has underlying hyperlipidemia which is currently diet controlled. He is due for recheck. He is followed by urology for erectile dysfunction and BPH he is still on testosterone and Flomax. He also uses Viagra as needed He did have a mildly elevated fasting glucose 6 months ago this is also due for recheck before l he continues to put on weight his weight is up 9 pounds since her last visit   does complain of left thumb pain on and off for the past couple months. 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 It will ache at times. He does take Aleve which helps. Occasionally he gets some swelling but he is still able to do his regular activities.   Review Of Systems:  GEN- denies fatigue, fever, weight loss,weakness, recent illness HEENT- denies eye drainage, change in vision, nasal discharge, CVS- denies chest pain, palpitations RESP- denies SOB, cough, wheeze ABD- denies N/V, change in stools, abd pain GU- denies dysuria, hematuria, dribbling, incontinence MSK- + joint pain, muscle aches, injury Neuro- denies headache, dizziness, syncope, seizure activity       Objective:    BP 138/64 mmHg  Pulse 72  Temp(Src) 99.7 F (37.6 C) (Oral)  Resp 16  Ht  (1.727 m)  Wt 246 lb (111.585 kg)  BMI 37.41 kg/m2 GEN- NAD, alert and oriented x3 HEENT- PERRL, EOMI, non injected sclera, pink conjunctiva, MMM, oropharynx clear CVS- RRR, ? Soft systolic murmur, LSB , no gallop  RESP-CTAB MSK- Left thumb , mild TTP at PIP, no swelling, good ROM  EXT- No edema Pulses- Radial  2+        Assessment & Plan:       Problem List Items Addressed This Visit    Obesity   Hyperlipidemia, mixed - Primary    Recheck lipids and glucose Discussed weight and dietary changes needed to improve these and prevent DM      Relevant Orders   CBC with Differential/Platelet   Basic metabolic panel   Lipid panel   Gouty tophi of joint    Continue allopurinol       Other Visit Diagnoses    Elevated fasting glucose        Relevant Orders    Hemoglobin A1c    Osteoarthritis of thumb, left        I think he has OA in his PIP, with his work and use of hands, trial of topical NSAIDs. Possible gout as well on allupurinol       Note: This dictation was prepared with Dragon dictation along with smaller phrase technology. Any transcriptional errors that result from this process are unintentional.

## 2015-11-22 NOTE — Assessment & Plan Note (Signed)
Recheck lipids and glucose Discussed weight and dietary changes needed to improve these and prevent DM

## 2015-11-22 NOTE — Assessment & Plan Note (Signed)
Continue allopurinol 

## 2015-11-22 NOTE — Patient Instructions (Signed)
Aspercreme or biofreeze for the thumb  Continue current medications Work on diet and get the weight back off  F/U 6 months for Physical

## 2015-11-25 ENCOUNTER — Encounter: Payer: Self-pay | Admitting: *Deleted

## 2015-12-18 ENCOUNTER — Other Ambulatory Visit: Payer: Self-pay | Admitting: Family Medicine

## 2016-01-15 ENCOUNTER — Other Ambulatory Visit: Payer: Self-pay | Admitting: Family Medicine

## 2016-01-15 NOTE — Telephone Encounter (Signed)
Refill appropriate and filled per protocol. 

## 2016-04-17 ENCOUNTER — Emergency Department (HOSPITAL_COMMUNITY)
Admission: EM | Admit: 2016-04-17 | Discharge: 2016-04-17 | Disposition: A | Discharge status: Home / Self Care | Payer: Managed Care, Other (non HMO) | Attending: Emergency Medicine | Admitting: Emergency Medicine

## 2016-04-17 DIAGNOSIS — Z79899 Other long term (current) drug therapy: Secondary | ICD-10-CM | POA: Diagnosis not present

## 2016-04-17 DIAGNOSIS — E785 Hyperlipidemia, unspecified: Secondary | ICD-10-CM | POA: Insufficient documentation

## 2016-04-17 DIAGNOSIS — Z791 Long term (current) use of non-steroidal anti-inflammatories (NSAID): Secondary | ICD-10-CM | POA: Insufficient documentation

## 2016-04-17 DIAGNOSIS — M791 Myalgia: Secondary | ICD-10-CM | POA: Diagnosis not present

## 2016-04-17 DIAGNOSIS — Z7982 Long term (current) use of aspirin: Secondary | ICD-10-CM | POA: Diagnosis not present

## 2016-04-17 DIAGNOSIS — R232 Flushing: Secondary | ICD-10-CM

## 2016-04-17 DIAGNOSIS — R51 Headache: Secondary | ICD-10-CM | POA: Diagnosis not present

## 2016-04-17 DIAGNOSIS — Z87891 Personal history of nicotine dependence: Secondary | ICD-10-CM | POA: Insufficient documentation

## 2016-04-17 LAB — CBC WITH DIFFERENTIAL/PLATELET
BASOS ABS: 0 10*3/uL (ref 0.0–0.1)
Basophils Relative: 0 %
Eosinophils Absolute: 0.3 10*3/uL (ref 0.0–0.7)
Eosinophils Relative: 3 %
HEMATOCRIT: 45 % (ref 39.0–52.0)
Hemoglobin: 15.2 g/dL (ref 13.0–17.0)
Lymphocytes Relative: 23 %
Lymphs Abs: 2.4 10*3/uL (ref 0.7–4.0)
MCH: 26.1 pg (ref 26.0–34.0)
MCHC: 33.8 g/dL (ref 30.0–36.0)
MCV: 77.2 fL — ABNORMAL LOW (ref 78.0–100.0)
MONOS PCT: 12 %
Monocytes Absolute: 1.3 10*3/uL — ABNORMAL HIGH (ref 0.1–1.0)
NEUTROS ABS: 6.4 10*3/uL (ref 1.7–7.7)
NEUTROS PCT: 62 %
Platelets: 275 10*3/uL (ref 150–400)
RBC: 5.83 MIL/uL — ABNORMAL HIGH (ref 4.22–5.81)
RDW: 17.7 % — ABNORMAL HIGH (ref 11.5–15.5)
WBC: 10.4 10*3/uL (ref 4.0–10.5)

## 2016-04-17 LAB — COMPREHENSIVE METABOLIC PANEL
ALBUMIN: 3.7 g/dL (ref 3.5–5.0)
ALK PHOS: 61 U/L (ref 38–126)
ALT: 22 U/L (ref 17–63)
AST: 21 U/L (ref 15–41)
Anion gap: 4 — ABNORMAL LOW (ref 5–15)
BILIRUBIN TOTAL: 0.5 mg/dL (ref 0.3–1.2)
BUN: 13 mg/dL (ref 6–20)
CALCIUM: 8.5 mg/dL — AB (ref 8.9–10.3)
CO2: 23 mmol/L (ref 22–32)
CREATININE: 1.3 mg/dL — AB (ref 0.61–1.24)
Chloride: 109 mmol/L (ref 101–111)
GFR calc Af Amer: >60 mL/min (ref >60)
GFR calc non Af Amer: 58 mL/min — ABNORMAL LOW (ref >60)
GLUCOSE: 100 mg/dL — AB (ref 65–99)
POTASSIUM: 4 mmol/L (ref 3.5–5.1)
Sodium: 136 mmol/L (ref 135–145)
Total Protein: 6.5 g/dL (ref 6.5–8.1)

## 2016-04-17 LAB — TSH: TSH: 2.193 u[IU]/mL (ref 0.350–4.500)

## 2016-04-17 NOTE — ED Provider Notes (Signed)
CSN: [id number]     Arrival date & time 04/17/16  2012 History  By signing my name below, I, Linna DarnerRussell Turner, attest that this documentation has been prepared under the direction and in the presence of physician practitioner, Marily MemosJason Graciana Sessa, MD. Electronically Signed: Linna Darnerussell Turner, Scribe. 04/17/2016. 8:43 PM.    Chief Complaint  Patient presents with  . Hot Flashes    The history is provided by the patient. No language interpreter was used.     HPI Comments: Larry Fleming is a 60 y.o. male who presents to the Emergency Department complaining of sudden onset, intermittent, hot flashes beginning last night. Pt reports that he woke up to go to the bathroom last night, went to lay back down, and became hot "all over" inside; he states he did not sleep all night due to the hot flashes. Pt states that it feels like "a fire is burning" inside of him when his hot flashes present. He notes that he experienced intermittent hot flashes today at work as well; his most recent episode was about an hour PTA while watching television. Pt notes that his hot flashes resolve for less than 15 minutes and then present again. Pt notes associated diaphoresis as well. He also notes an intermittent, throbbing headache beginning several hours ago. He reports that he pulled a muscle in his right shoulder yesterday while tossing a trash bag into a trash can; he states his hot flashes presented afterwards but does not believe the two symptoms are related. Pt reports that he experienced similar symptoms several years ago with no known cause. He denies recent [place] travel, smoking, EtOH use, other drug use, eating or drinking anything new recently, h/o thyroid problems, or using new medications. Pt does not use any medications with niacin to his knowledge. Pt uses tamsulosin, allopurinol, aleve, and androgel daily; he has not changed any dosages recently. He denies skin changes, rash, SOB, cough, CP, leg pain, vision changes, abdominal  pain, diarrhea, vomiting, dysuria, increased urinary frequency, or any other associated symptoms.  Past Medical History  Diagnosis Date  . Gout   . Hyperlipidemia   . BPH (benign prostatic hypertrophy)    No past surgical history on file. Family History  Problem Relation Age of Onset  . Diabetes Brother   . Stroke Brother   . Cancer Brother     prostate  . Kidney disease Sister    Social History  Substance Use Topics  . Smoking status: Former Smoker -- 1.00 packs/day for 4 years    Types: Cigarettes    Quit date: 02/09/1998  . Smokeless tobacco: Never Used  . Alcohol Use: No    Review of Systems  Constitutional: Positive for diaphoresis.       Positive for hot flashes.  Eyes: Negative for visual disturbance.  Respiratory: Negative for cough and shortness of breath.   Cardiovascular: Negative for chest pain.  Gastrointestinal: Negative for vomiting, abdominal pain and diarrhea.  Genitourinary: Negative for dysuria and frequency.  Musculoskeletal: Positive for myalgias (right shoulder).  Skin: Negative for rash.  Neurological: Positive for headaches.  All other systems reviewed and are negative.   Allergies  Review of patient's allergies indicates no known allergies.  Home Medications   Prior to Admission medications   Medication Sig Start Date End Date Taking? Authorizing Provider  allopurinol (ZYLOPRIM) 100 MG tablet TAKE 2 TABLETS(200 MG) BY MOUTH DAILY 01/15/16  Yes Salley ScarletKawanta F Quitman, MD  aspirin EC 81 MG tablet Take 81 mg by  mouth daily.   Yes Historical Provider, MD  naproxen sodium (ALEVE) 220 MG tablet Take 220 mg by mouth 2 (two) times daily with a meal.   Yes Historical Provider, MD  sildenafil (VIAGRA) 100 MG tablet Take 0.5-1 tablets (50-100 mg total) by mouth daily as needed for erectile dysfunction. 05/22/15  Yes Salley Scarlet, MD  tamsulosin (FLOMAX) 0.4 MG CAPS capsule Take 0.4 mg by mouth every evening.    Yes Historical Provider, MD  Testosterone  (ANDROGEL PUMP) 20.25 MG/ACT (1.62%) GEL PLACE 3 PUMPS ONTO THE SKIN EVERY DAY AS DIRECTED Patient taking differently: PLACE 4 PUMPS ONTO THE SKIN EVERY DAY AS DIRECTED 03/23/14  Yes Salley Scarlet, MD   BP 164/82 mmHg  Pulse 73  Temp(Src) 98 F (36.7 C)  Resp 18  Ht  (1.727 m)  Wt 254 lb (115.214 kg)  BMI 38.63 kg/m2  SpO2 98% Physical Exam  Constitutional: He is oriented to person, place, and time. He appears well-developed and well-nourished. No distress.  HENT:  Head: Normocephalic and atraumatic.  Eyes: Conjunctivae and EOM are normal.  Neck: Neck supple. No tracheal deviation present.  Cardiovascular: Normal rate and regular rhythm.   Murmur heard. 1/6 systolic heart murmur.  Pulmonary/Chest: Effort normal and breath sounds normal. No respiratory distress. He has no wheezes. He has no rales.  Abdominal: There is no tenderness.  Musculoskeletal: Normal range of motion.  Neurological: He is alert and oriented to person, place, and time.  Skin: Skin is warm and dry. No rash noted.  Psychiatric: He has a normal mood and affect. His behavior is normal.  Nursing note and vitals reviewed.   ED Course  Procedures (including critical care time)  DIAGNOSTIC STUDIES: Oxygen Saturation is 99% on RA, normal by my interpretation.    COORDINATION OF CARE: 8:43 PM Discussed treatment plan with pt at bedside and pt agreed to plan.  Labs Review Labs Reviewed  CBC WITH DIFFERENTIAL/PLATELET - Abnormal; Notable for the following:    RBC 5.83 (*)    MCV 77.2 (*)    RDW 17.7 (*)    Monocytes Absolute 1.3 (*)    All other components within normal limits  COMPREHENSIVE METABOLIC PANEL - Abnormal; Notable for the following:    Glucose, Bld 100 (*)    Creatinine, Ser 1.30 (*)    Calcium 8.5 (*)    GFR calc non Af Amer 58 (*)    Anion gap 4 (*)    All other components within normal limits  TSH    Imaging Review No results found. I have personally reviewed and evaluated  these images and lab results as part of my medical decision-making.   EKG Interpretation None      MDM   Final diagnoses:  Hot flashes   60 year old male here with hot flashes. Unknown origin. Patient states she does not take Viagra and is not taking a multiple months of does not likely the cause. No new foods or medications or illnesses. Not related to food. No other associated symptoms to give any clue to the cause of it. The hot flashes seem to be improving and has not had one in quite a while. TSH and lites were checked and were normal. Unsure of the cause of symptoms however continue following up with his primary doctor for further workup.  New Prescriptions: Discharge Medication List as of 04/17/2016 10:32 PM       I have personally and contemperaneously reviewed labs and imaging and  used in my decision making as above.   A medical screening exam was performed and I feel the patient has had an appropriate workup for their chief complaint at this time and likelihood of emergent condition existing is low and thus workup can continue on an outpatient basis.. Their vital signs are stable. They have been counseled on decision, discharge, follow up and which symptoms necessitate immediate return to the emergency department.  They verbally stated understanding and agreement with plan and discharged in stable condition.    I personally performed the services described in this documentation, which was scribed in my presence. The recorded information has been reviewed and is accurate.   Marily MemosJason Pruitt Taboada, MD 04/17/16 [phone]

## 2016-04-17 NOTE — ED Notes (Signed)
Pt reports last night when he went to bed, he began having episodes of "hot flashes." Pt states he feels like his body is on fire on the inside. Pt states he has had these episodes periodically today as well. Pt also c/o feeling like he pulled a muscle in his right bicep yesterday.

## 2016-04-17 NOTE — ED Notes (Signed)
Patient verbalizes understanding of discharge instructions, home care and follow up care. Patient out of department at this time. 

## 2016-05-27 ENCOUNTER — Other Ambulatory Visit: Payer: Self-pay | Admitting: Family Medicine

## 2016-05-27 ENCOUNTER — Other Ambulatory Visit: Payer: Managed Care, Other (non HMO)

## 2016-05-27 DIAGNOSIS — E291 Testicular hypofunction: Secondary | ICD-10-CM

## 2016-05-27 DIAGNOSIS — N400 Enlarged prostate without lower urinary tract symptoms: Secondary | ICD-10-CM

## 2016-05-27 DIAGNOSIS — E785 Hyperlipidemia, unspecified: Secondary | ICD-10-CM

## 2016-05-27 DIAGNOSIS — E669 Obesity, unspecified: Secondary | ICD-10-CM

## 2016-05-27 DIAGNOSIS — Z79899 Other long term (current) drug therapy: Secondary | ICD-10-CM

## 2016-05-27 DIAGNOSIS — Z0000 Encounter for general adult medical examination without abnormal findings: Secondary | ICD-10-CM

## 2016-05-29 ENCOUNTER — Ambulatory Visit (INDEPENDENT_AMBULATORY_CARE_PROVIDER_SITE_OTHER): Payer: Managed Care, Other (non HMO) | Admitting: Family Medicine

## 2016-05-29 ENCOUNTER — Encounter: Payer: Self-pay | Admitting: Family Medicine

## 2016-05-29 VITALS — BP 148/74 | HR 84 | Temp 97.9°F | Resp 16 | Ht 68.0 in | Wt 247.0 lb

## 2016-05-29 DIAGNOSIS — S46011A Strain of muscle(s) and tendon(s) of the rotator cuff of right shoulder, initial encounter: Secondary | ICD-10-CM

## 2016-05-29 DIAGNOSIS — Z1211 Encounter for screening for malignant neoplasm of colon: Secondary | ICD-10-CM | POA: Diagnosis not present

## 2016-05-29 DIAGNOSIS — E782 Mixed hyperlipidemia: Secondary | ICD-10-CM

## 2016-05-29 DIAGNOSIS — R030 Elevated blood-pressure reading, without diagnosis of hypertension: Secondary | ICD-10-CM

## 2016-05-29 DIAGNOSIS — Z0000 Encounter for general adult medical examination without abnormal findings: Secondary | ICD-10-CM

## 2016-05-29 DIAGNOSIS — Z1159 Encounter for screening for other viral diseases: Secondary | ICD-10-CM

## 2016-05-29 DIAGNOSIS — E669 Obesity, unspecified: Secondary | ICD-10-CM | POA: Diagnosis not present

## 2016-05-29 DIAGNOSIS — IMO0001 Reserved for inherently not codable concepts without codable children: Secondary | ICD-10-CM

## 2016-05-29 LAB — HEPATITIS C ANTIBODY: HCV Ab: NEGATIVE

## 2016-05-29 LAB — LIPID PANEL
CHOL/HDL RATIO: 6.3 ratio — AB (ref <=5.0)
CHOLESTEROL: 158 mg/dL (ref 125–200)
HDL: 25 mg/dL — AB (ref >=40)
LDL Cholesterol: 86 mg/dL (ref <130)
Triglycerides: 234 mg/dL — ABNORMAL HIGH (ref <150)
VLDL: 47 mg/dL — ABNORMAL HIGH (ref <30)

## 2016-05-29 LAB — COMPREHENSIVE METABOLIC PANEL
ALK PHOS: 67 U/L (ref 40–115)
ALT: 19 U/L (ref 9–46)
AST: 19 U/L (ref 10–35)
Albumin: 3.8 g/dL (ref 3.6–5.1)
BUN: 16 mg/dL (ref 7–25)
CALCIUM: 8.7 mg/dL (ref 8.6–10.3)
CO2: 19 mmol/L — ABNORMAL LOW (ref 20–31)
Chloride: 106 mmol/L (ref 98–110)
Creat: 1.31 mg/dL — ABNORMAL HIGH (ref 0.70–1.25)
GLUCOSE: 92 mg/dL (ref 70–99)
POTASSIUM: 4.5 mmol/L (ref 3.5–5.3)
Sodium: 138 mmol/L (ref 135–146)
Total Bilirubin: 0.5 mg/dL (ref 0.2–1.2)
Total Protein: 6.3 g/dL (ref 6.1–8.1)

## 2016-05-29 LAB — CBC WITH DIFFERENTIAL/PLATELET
BASOS ABS: 0 {cells}/uL (ref 0–200)
BASOS PCT: 0 %
EOS ABS: 252 {cells}/uL (ref 15–500)
Eosinophils Relative: 4 %
HEMATOCRIT: 49.1 % (ref 38.5–50.0)
Hemoglobin: 16.1 g/dL (ref 13.0–17.0)
LYMPHS PCT: 26 %
Lymphs Abs: 1638 cells/uL (ref 850–3900)
MCH: 26.1 pg — ABNORMAL LOW (ref 27.0–33.0)
MCHC: 32.8 g/dL (ref 32.0–36.0)
MCV: 79.6 fL — AB (ref 80.0–100.0)
MONO ABS: 630 {cells}/uL (ref 200–950)
MPV: 10.3 fL (ref 7.5–12.5)
Monocytes Relative: 10 %
Neutro Abs: 3780 cells/uL (ref 1500–7800)
Neutrophils Relative %: 60 %
Platelets: 278 10*3/uL (ref 140–400)
RBC: 6.17 MIL/uL — ABNORMAL HIGH (ref 4.20–5.80)
RDW: 18.2 % — ABNORMAL HIGH (ref 11.0–15.0)
WBC: 6.3 10*3/uL (ref 3.8–10.8)

## 2016-05-29 MED ORDER — DICLOFENAC SODIUM 75 MG PO TBEC: 75.0000 mg | DELAYED_RELEASE_TABLET | Freq: Two times a day (BID) | ORAL | 1 refills | Status: DC

## 2016-05-29 NOTE — Assessment & Plan Note (Signed)
No current medications

## 2016-05-29 NOTE — Progress Notes (Signed)
   Subjective:    Patient ID: Larry Fleming, male    DOB: Feb 07, 1956, 60 y.o.   MRN: [id number]  Patient presents for CPE (is fasting) Medications and history reviewed   Still follow with urology for His PSA and his BPH erectile dysfunction. He is currently on testosterone therapy Colonoscopy- Due this year Immunizations UTD  Discussed hepatitis C screening  His only concern today is right shoulder pain. About for 5 weeks ago he was tossing trash bag after that felt a pop he's had some soreness in the shoulder and some pain with range of motion. He takes Aleve daily already. It is improved some but he still has some soreness on the top of the shoulder. He denies any tingling numbness in his fingertips denies any neck pain  Review Of Systems:  GEN- denies fatigue, fever, weight loss,weakness, recent illness HEENT- denies eye drainage, change in vision, nasal discharge, CVS- denies chest pain, palpitations RESP- denies SOB, cough, wheeze ABD- denies N/V, change in stools, abd pain GU- denies dysuria, hematuria, dribbling, incontinence MSK- + joint pain, muscle aches, injury Neuro- denies headache, dizziness, syncope, seizure activity       Objective:    BP (!) 148/74 (BP Location: Right Arm, Patient Position: Sitting, Cuff Size: Large)   Pulse 84   Temp 97.9 F (36.6 C) (Oral)   Resp 16   Ht 5\' 8"  (1.727 m)   Wt 247 lb (112 kg)   BMI 37.56 kg/m  GEN- NAD, alert and oriented x3 HEENT- PERRL, EOMI, non injected sclera, pink conjunctiva, MMM, oropharynx clear Neck- Supple, no thyromegaly, good ROM CVS- RRR, no murmur RESP-CTAB MSK- Bilat shoulder inspection normal, biceps in tact, + empty can right side, neg impingment signs, decreased ROM with elevation of arm and back scratch R compared to left  ABD-NABS,soft,NT,ND EXT- No edema Pulses- Radial, DP- 2+        Assessment & Plan:      Problem List Items Addressed This Visit    Obesity   Hyperlipidemia, mixed -  Primary    No current medications      Relevant Orders   Lipid panel    Other Visit Diagnoses    Routine general medical examination at a health care facility       CPE done, fasting labs, referral for colonoscopy, discussed weight management    Relevant Orders   CBC with Differential/Platelet   Comprehensive metabolic panel   Lipid panel   Rotator cuff strain, right, initial encounter       change to diclofenac, exercises given, will see how he dose next couple of weeks, strength still in tact, if not improved ortho referral    Colon cancer screening       Relevant Orders   Ambulatory referral to Gastroenterology   Elevated blood pressure       recheck in 6 weeks, discussed diet changes , if not improved start norvasc 5mg     Need for hepatitis C screening test       Relevant Orders   Hepatitis C antibody      Note: This dictation was prepared with Dragon dictation along with smaller phrase technology. Any transcriptional errors that result from this process are unintentional.

## 2016-05-29 NOTE — Patient Instructions (Signed)
Take the diclofenac twice a day  Stop ALEVE  Referral for colonoscopy  Work on diet and weight  I recommend eye visit once a year I recommend dental visit every 6 months Goal is to  Exercise 30 minutes 5 days a week We will send a letter with lab results  F/U 6 weeks

## 2016-06-04 ENCOUNTER — Encounter: Payer: Self-pay | Admitting: *Deleted

## 2016-07-10 ENCOUNTER — Ambulatory Visit: Payer: Managed Care, Other (non HMO) | Admitting: Family Medicine

## 2016-07-20 ENCOUNTER — Telehealth: Payer: Self-pay

## 2016-07-20 NOTE — Telephone Encounter (Signed)
Patient called back asking to speak with DS. I told him that she was not available and a phone note had been put in for her to call.

## 2016-07-20 NOTE — Telephone Encounter (Signed)
Gastroenterology Pre-Procedure Review  Request Date: 07/20/2016 Requesting Physician: RECALL  PATIENT REVIEW QUESTIONS: The patient responded to the following health history questions as indicated:     1. Diabetes Melitis: no 2. Joint replacements in the past 12 months: no 3. Major health problems in the past 3 months: no 4. Has an artificial valve or MVP: no 5. Has a defibrillator: no 6. Has been advised in past to take antibiotics in advance of a procedure like teeth cleaning: no 7. Family history of colon cancer: no  8. Alcohol Use: no 9. History of sleep apnea: no     MEDICATIONS & ALLERGIES:    Patient reports the following regarding taking any blood thinners:   Plavix? no Aspirin? YES Coumadin? no  Patient confirms/reports the following medications:  Current Outpatient Prescriptions  Medication Sig Dispense Refill  . allopurinol (ZYLOPRIM) 100 MG tablet TAKE 2 TABLETS(200 MG) BY MOUTH DAILY 60 tablet 3  . aspirin EC 81 MG tablet Take 81 mg by mouth daily.    . naproxen sodium (ALEVE) 220 MG tablet Take 220 mg by mouth 2 (two) times daily with a meal.    . tamsulosin (FLOMAX) 0.4 MG CAPS capsule Take 0.4 mg by mouth every evening.     . diclofenac (VOLTAREN) 75 MG EC tablet Take 1 tablet (75 mg total) by mouth 2 (two) times daily. (Patient not taking: Reported on 07/20/2016) 60 tablet 1  . sildenafil (VIAGRA) 100 MG tablet Take 0.5-1 tablets (50-100 mg total) by mouth daily as needed for erectile dysfunction. 10 tablet 3  . Testosterone (ANDROGEL PUMP) 20.25 MG/ACT (1.62%) GEL PLACE 3 PUMPS ONTO THE SKIN EVERY DAY AS DIRECTED (Patient taking differently: PLACE 4 PUMPS ONTO THE SKIN EVERY DAY AS DIRECTED) 75 g 2   No current facility-administered medications for this visit.     Patient confirms/reports the following allergies:  No Known Allergies  No orders of the defined types were placed in this encounter.   AUTHORIZATION INFORMATION Primary Insurance:   ID #:   Group  #:  Pre-Cert / Auth required:  Pre-Cert / Auth #:   Secondary Insurance:   ID #:   Group #:  Pre-Cert / Auth required: Pre-Cert / Auth #:   SCHEDULE INFORMATION: Procedure has been scheduled as follows:  Date:                     Time:  Location:   This Gastroenterology Pre-Precedure Review Form is being routed to the following provider(s): R. Roetta SessionsMichael Rourk, MD

## 2016-07-20 NOTE — Telephone Encounter (Signed)
Pt received a triage letter from DS. Please call him at [phone]

## 2016-07-21 NOTE — Telephone Encounter (Signed)
Ok to schedule.

## 2016-07-28 ENCOUNTER — Telehealth: Payer: Self-pay

## 2016-07-28 NOTE — Telephone Encounter (Signed)
PT has been scheduled with Dr. Jena Gaussourk on 08/11/2016 at 1:15 pm [facility]

## 2016-07-28 NOTE — Telephone Encounter (Signed)
Pt called asking to speak with DS. I told him that DS was discharging patients at the moment and offered to put him to voice mail. He said he has already left 3 voice mails. I told him that she was out last week and today was her first day back and she is covering for another nurse that is out. He said it was a good thing he wasn't sick and dying. I told him that DS was aware of his call and she would get to him but couldn't at this moment. Patient said to have a nice day and hung up.

## 2016-07-28 NOTE — Telephone Encounter (Signed)
See triage. Pt has been scheduled.

## 2016-07-29 ENCOUNTER — Other Ambulatory Visit: Payer: Self-pay

## 2016-07-29 DIAGNOSIS — Z1211 Encounter for screening for malignant neoplasm of colon: Secondary | ICD-10-CM

## 2016-07-29 MED ORDER — NA SULFATE-K SULFATE-MG SULF 17.5-3.13-1.6 GM/177ML PO SOLN: 1.0000 | ORAL | 0 refills | Status: DC

## 2016-07-29 NOTE — Addendum Note (Signed)
Addended by: Lavena BullionSTEWART, Eulogio Requena H on: 07/29/2016 08:37 AM   Modules accepted: Orders

## 2016-07-29 NOTE — Telephone Encounter (Signed)
Rx sent to the pharmacy and instructions mailed to pt.  

## 2016-08-10 ENCOUNTER — Telehealth: Payer: Self-pay

## 2016-08-10 NOTE — Telephone Encounter (Signed)
I called [employer] @ [phone] and spoke to [facility] who said that a PA is not required for the screening colonoscopy. No refernce number required, just his name and this date and time.

## 2016-08-11 ENCOUNTER — Encounter (HOSPITAL_COMMUNITY): Payer: Self-pay

## 2016-08-11 ENCOUNTER — Encounter (HOSPITAL_COMMUNITY)
Admission: RE | Disposition: A | Discharge status: Home / Self Care | Payer: Self-pay | Source: Ambulatory Visit | Attending: Internal Medicine

## 2016-08-11 ENCOUNTER — Ambulatory Visit (HOSPITAL_COMMUNITY)
Admission: RE | Admit: 2016-08-11 | Discharge: 2016-08-11 | Disposition: A | Discharge status: Home / Self Care | Payer: Managed Care, Other (non HMO) | Source: Ambulatory Visit | Attending: Internal Medicine | Admitting: Internal Medicine

## 2016-08-11 DIAGNOSIS — Z7982 Long term (current) use of aspirin: Secondary | ICD-10-CM | POA: Diagnosis not present

## 2016-08-11 DIAGNOSIS — Z87891 Personal history of nicotine dependence: Secondary | ICD-10-CM | POA: Insufficient documentation

## 2016-08-11 DIAGNOSIS — E785 Hyperlipidemia, unspecified: Secondary | ICD-10-CM | POA: Insufficient documentation

## 2016-08-11 DIAGNOSIS — N400 Enlarged prostate without lower urinary tract symptoms: Secondary | ICD-10-CM | POA: Insufficient documentation

## 2016-08-11 DIAGNOSIS — Z1212 Encounter for screening for malignant neoplasm of rectum: Secondary | ICD-10-CM

## 2016-08-11 DIAGNOSIS — K5730 Diverticulosis of large intestine without perforation or abscess without bleeding: Secondary | ICD-10-CM | POA: Diagnosis not present

## 2016-08-11 DIAGNOSIS — Z79899 Other long term (current) drug therapy: Secondary | ICD-10-CM | POA: Diagnosis not present

## 2016-08-11 DIAGNOSIS — M109 Gout, unspecified: Secondary | ICD-10-CM | POA: Diagnosis not present

## 2016-08-11 DIAGNOSIS — Z1211 Encounter for screening for malignant neoplasm of colon: Secondary | ICD-10-CM | POA: Diagnosis present

## 2016-08-11 HISTORY — PX: COLONOSCOPY: SHX5424

## 2016-08-11 HISTORY — DX: Other specified health status: Z78.9

## 2016-08-11 SURGERY — COLONOSCOPY
Anesthesia: Moderate Sedation

## 2016-08-11 MED ORDER — MEPERIDINE HCL 100 MG/ML IJ SOLN
INTRAMUSCULAR | Status: AC
  Filled 2016-08-11: qty 2

## 2016-08-11 MED ORDER — MEPERIDINE HCL 100 MG/ML IJ SOLN
INTRAMUSCULAR | Status: DC | PRN
  Administered 2016-08-11: 25 mg via INTRAVENOUS
  Administered 2016-08-11: 50 mg via INTRAVENOUS

## 2016-08-11 MED ORDER — SODIUM CHLORIDE 0.9 % IV SOLN: INTRAVENOUS | Status: DC

## 2016-08-11 MED ORDER — ONDANSETRON HCL 4 MG/2ML IJ SOLN
INTRAMUSCULAR | Status: AC
  Filled 2016-08-11: qty 2

## 2016-08-11 MED ORDER — MIDAZOLAM HCL 5 MG/5ML IJ SOLN
INTRAMUSCULAR | Status: AC
  Filled 2016-08-11: qty 10

## 2016-08-11 MED ORDER — ONDANSETRON HCL 4 MG/2ML IJ SOLN
INTRAMUSCULAR | Status: DC | PRN
  Administered 2016-08-11: 4 mg via INTRAVENOUS

## 2016-08-11 MED ORDER — STERILE WATER FOR IRRIGATION IR SOLN
Status: DC | PRN
  Administered 2016-08-11: 13:00:00

## 2016-08-11 MED ORDER — MIDAZOLAM HCL 5 MG/5ML IJ SOLN
INTRAMUSCULAR | Status: DC | PRN
  Administered 2016-08-11: 2 mg via INTRAVENOUS
  Administered 2016-08-11 (×2): 1 mg via INTRAVENOUS

## 2016-08-11 NOTE — H&P (Signed)
@LOGO @   Primary Care Physician:  Vic Blackbird, MD Primary Gastroenterologist:  Dr. Gala Romney  Pre-Procedure History & Physical: HPI:  Larry Fleming is a 60 y.o. male is here for a screening colonoscopy.  No bowel symptoms. Patient states he had negative colonoscopy 10 years ago. No family history of colon cancer.  Past Medical History:  Diagnosis Date  . BPH (benign prostatic hypertrophy)   . Gout   . Hyperlipidemia   . Medical history non-contributory     Past Surgical History:  Procedure Laterality Date  . CIRCUMCISION  30 years ago    Prior to Admission medications   Medication Sig Start Date End Date Taking? Authorizing Provider  allopurinol (ZYLOPRIM) 100 MG tablet TAKE 2 TABLETS(200 MG) BY MOUTH DAILY 01/15/16  Yes Alycia Rossetti, MD  aspirin EC 81 MG tablet Take 81 mg by mouth daily.   Yes Historical Provider, MD  Na Sulfate-K Sulfate-Mg Sulf (SUPREP BOWEL PREP KIT) 17.5-3.13-1.6 GM/180ML SOLN Take 1 kit by mouth as directed. 07/29/16  Yes Daneil Dolin, MD  naproxen sodium (ALEVE) 220 MG tablet Take 220 mg by mouth 2 (two) times daily with a meal.   Yes Historical Provider, MD  sildenafil (VIAGRA) 100 MG tablet Take 0.5-1 tablets (50-100 mg total) by mouth daily as needed for erectile dysfunction. 05/22/15  Yes Alycia Rossetti, MD  tamsulosin (FLOMAX) 0.4 MG CAPS capsule Take 0.4 mg by mouth at bedtime.    Yes Historical Provider, MD  Testosterone (ANDROGEL PUMP) 20.25 MG/ACT (1.62%) GEL PLACE 3 PUMPS ONTO THE SKIN EVERY DAY AS DIRECTED Patient taking differently: Apply 4 Squirts topically as directed. PLACE 4 PUMPS ONTO THE SKIN EVERY DAY (2 PUMPS APPLIED TO EACH SHOULDER) AS DIRECTED 03/23/14  Yes Alycia Rossetti, MD  diclofenac (VOLTAREN) 75 MG EC tablet Take 1 tablet (75 mg total) by mouth 2 (two) times daily. Patient not taking: Reported on 07/20/2016 05/29/16   Alycia Rossetti, MD    Allergies as of 07/29/2016  . (No Known Allergies)    Family History  Problem  Relation Age of Onset  . Diabetes Brother   . Stroke Brother   . Cancer Brother     prostate  . Kidney disease Sister     Social History   Social History  . Marital status: Married    Spouse name: N/A  . Number of children: N/A  . Years of education: N/A   Occupational History  . Not on file.   Social History Main Topics  . Smoking status: Former Smoker    Packs/day: 1.00    Years: 4.00    Types: Cigarettes    Quit date: 02/09/1998  . Smokeless tobacco: Never Used  . Alcohol use No  . Drug use: No  . Sexual activity: Yes   Other Topics Concern  . Not on file   Social History Narrative  . No narrative on file    Review of Systems: See HPI, otherwise negative ROS  Physical Exam: BP 133/90   Pulse 68   Temp 97.6 F (36.4 C) (Oral)   Resp 19   Ht 5' 8"  (1.727 m)   Wt 254 lb (115.2 kg)   SpO2 99%   BMI 38.62 kg/m  General:   Alert,  Well-developed, well-nourished, pleasant and cooperative in NAD Neck:  Supple; no masses or thyromegaly. Lungs:  Clear throughout to auscultation.   No wheezes, crackles, or rhonchi. No acute distress. Heart:  Regular rate and rhythm; no  murmurs, clicks, rubs,  or gallops. Abdomen:  Soft, nontender and nondistended. No masses, hepatosplenomegaly or hernias noted. Normal bowel sounds, without guarding, and without rebound.    Impression/Plan: Larry Fleming is now here to undergo a screening colonoscopy.  Average risk screening examination. Risks, benefits, limitations, imponderables and alternatives regarding colonoscopy have been reviewed with the patient. Questions have been answered. All parties agreeable.     Notice:  This dictation was prepared with Dragon dictation along with smaller phrase technology. Any transcriptional errors that result from this process are unintentional and may not be corrected upon review.

## 2016-08-11 NOTE — Discharge Instructions (Addendum)
°Colonoscopy °Discharge Instructions ° °Read the instructions outlined below and refer to this sheet in the next few weeks. These discharge instructions provide you with general information on caring for yourself after you leave the hospital. Your doctor may also give you specific instructions. While your treatment has been planned according to the most current medical practices available, unavoidable complications occasionally occur. If you have any problems or questions after discharge, call Dr. Rourk at [phone]. °ACTIVITY °· You may resume your regular activity, but move at a slower pace for the next 24 hours.  °· Take frequent rest periods for the next 24 hours.  °· Walking will help get rid of the air and reduce the bloated feeling in your belly (abdomen).  °· No driving for 24 hours (because of the medicine (anesthesia) used during the test).   °· Do not sign any important legal documents or operate any machinery for 24 hours (because of the anesthesia used during the test).  °NUTRITION °· Drink plenty of fluids.  °· You may resume your normal diet as instructed by your doctor.  °· Begin with a light meal and progress to your normal diet. Heavy or fried foods are harder to digest and may make you feel sick to your stomach (nauseated).  °· Avoid alcoholic beverages for 24 hours or as instructed.  °MEDICATIONS °· You may resume your normal medications unless your doctor tells you otherwise.  °WHAT YOU CAN EXPECT TODAY °· Some feelings of bloating in the abdomen.  °· Passage of more gas than usual.  °· Spotting of blood in your stool or on the toilet paper.  °IF YOU HAD POLYPS REMOVED DURING THE COLONOSCOPY: °· No aspirin products for 7 days or as instructed.  °· No alcohol for 7 days or as instructed.  °· Eat a soft diet for the next 24 hours.  °FINDING OUT THE RESULTS OF YOUR TEST °Not all test results are available during your visit. If your test results are not back during the visit, make an appointment  with your caregiver to find out the results. Do not assume everything is normal if you have not heard from your caregiver or the medical facility. It is important for you to follow up on all of your test results.  °SEEK IMMEDIATE MEDICAL ATTENTION IF: °· You have more than a spotting of blood in your stool.  °· Your belly is swollen (abdominal distention).  °· You are nauseated or vomiting.  °· You have a temperature over 101.  °· You have abdominal pain or discomfort that is severe or gets worse throughout the day.  ° ° ° °Diverticulosis information provided ° °Repeat screening colonoscopy in 10 years ° °Diverticulosis °Diverticulosis is the condition that develops when small pouches (diverticula) form in the wall of your colon. Your colon, or large intestine, is where water is absorbed and stool is formed. The pouches form when the inside layer of your colon pushes through weak spots in the outer layers of your colon. °CAUSES  °No one knows exactly what causes diverticulosis. °RISK FACTORS °· Being older than 50. Your risk for this condition increases with age. Diverticulosis is rare in people younger than 40 years. By age 80, almost everyone has it. °· Eating a low-fiber diet. °· Being frequently constipated. °· Being overweight. °· Not getting enough exercise. °· Smoking. °· Taking over-the-counter pain medicines, like aspirin and ibuprofen. °SYMPTOMS  °Most people with diverticulosis do not have symptoms. °DIAGNOSIS  °Because diverticulosis often has no   symptoms, health care providers often discover the condition during an exam for other colon problems. In many cases, a health care provider will diagnose diverticulosis while using a flexible scope to examine the colon (colonoscopy). °TREATMENT  °If you have never developed an infection related to diverticulosis, you may not need treatment. If you have had an infection before, treatment may include: °· Eating more fruits, vegetables, and grains. °· Taking a  fiber supplement. °· Taking a live bacteria supplement (probiotic). °· Taking medicine to relax your colon. °HOME CARE INSTRUCTIONS  °· Drink at least 6-8 glasses of water each day to prevent constipation. °· Try not to strain when you have a bowel movement. °· Keep all follow-up appointments. °If you have had an infection before:  °· Increase the fiber in your diet as directed by your health care provider or dietitian. °· Take a dietary fiber supplement if your health care provider approves. °· Only take medicines as directed by your health care provider. °SEEK MEDICAL CARE IF:  °· You have abdominal pain. °· You have bloating. °· You have cramps. °· You have not gone to the bathroom in 3 days. °SEEK IMMEDIATE MEDICAL CARE IF:  °· Your pain gets worse. °· Your bloating becomes very bad. °· You have a fever or chills, and your symptoms suddenly get worse. °· You begin vomiting. °· You have bowel movements that are bloody or black. °MAKE SURE YOU: °· Understand these instructions. °· Will watch your condition. °· Will get help right away if you are not doing well or get worse. °  °This information is not intended to replace advice given to you by your health care provider. Make sure you discuss any questions you have with your health care provider. °  °Document Released: 06/25/2004 Document Revised: 10/03/2013 Document Reviewed: 08/23/2013 °Elsevier Interactive Patient Education ©2016 [employer] ° °

## 2016-08-11 NOTE — Op Note (Signed)
[facility] Patient Name: Larry Fleming Procedure Date: 08/11/2016 12:38 PM MRN: [id number] Date of Birth: 1956-02-26 Attending MD: Gennette Pac , MD CSN: [id number] Age: 60 Admit Type: Outpatient Procedure:                Colonoscopy Indications:              Screening for colorectal malignant neoplasm Providers:                Gennette Pac, MD, Loma Messing B. Mathis Fare RN, RN,                            Burke Keels, Technician Referring MD:              Medicines:                Midazolam 4 mg IV, Meperidine 75 mg IV, Ondansetron                            4 mg IV Complications:            No immediate complications. Estimated Blood Loss:     Estimated blood loss: none. Procedure:                Pre-Anesthesia Assessment:                           - Prior to the procedure, a History and Physical                            was performed, and patient medications and                            allergies were reviewed. The patient's tolerance of                            previous anesthesia was also reviewed. The risks                            and benefits of the procedure and the sedation                            options and risks were discussed with the patient.                            All questions were answered, and informed consent                            was obtained. Prior Anticoagulants: The patient has                            taken no previous anticoagulant or antiplatelet                            agents. ASA Grade Assessment: II - A patient with  mild systemic disease. After reviewing the risks                            and benefits, the patient was deemed in                            satisfactory condition to undergo the procedure.                           After obtaining informed consent, the colonoscope                            was passed under direct vision. Throughout the                            procedure,  the patient's blood pressure, pulse, and                            oxygen saturations were monitored continuously. The                            [id number]) scope was introduced through                            the anus and advanced to the the cecum, identified                            by appendiceal orifice and ileocecal valve. The                            colonoscopy was performed without difficulty. The                            patient tolerated the procedure well. The quality                            of the bowel preparation was adequate. The quality                            of the bowel preparation was adequate. The                            ileocecal valve, appendiceal orifice, and rectum                            were photographed. Scope In: 12:50:08 PM Scope Out: 1:00:09 PM Scope Withdrawal Time: 0 hours 6 minutes 47 seconds  Total Procedure Duration: 0 hours 10 minutes 1 second  Findings:      The perianal and digital rectal examinations were normal.      Scattered small and large-mouthed diverticula were found in the sigmoid       colon and descending colon.      The exam was otherwise without abnormality on direct and retroflexion       views. Impression:               -  Diverticulosis in the sigmoid colon and in the                            descending colon.                           - The examination was otherwise normal on direct                            and retroflexion views.                           - No specimens collected. Moderate Sedation:      Moderate (conscious) sedation was administered by the endoscopy nurse       and supervised by the endoscopist. The following parameters were       monitored: oxygen saturation, heart rate, blood pressure, respiratory       rate, EKG, adequacy of pulmonary ventilation, and response to care.       Total physician intraservice time was 18 minutes. Recommendation:           - Patient has a contact  number available for                            emergencies. The signs and symptoms of potential                            delayed complications were discussed with the                            patient. Return to normal activities tomorrow.                            Written discharge instructions were provided to the                            patient.                           - Resume previous diet.                           - Continue present medications.                           - Repeat colonoscopy in 10 years for screening                            purposes.                           - Return to GI clinic PRN. Procedure Code(s):        --- Professional ---                           [phone], Colonoscopy, flexible; diagnostic, including  collection of specimen(s) by brushing or washing,                            when performed (separate procedure)                           99152, Moderate sedation services provided by the                            same physician or other qualified health care                            professional performing the diagnostic or                            therapeutic service that the sedation supports,                            requiring the presence of an independent trained                            observer to assist in the monitoring of the                            patient's level of consciousness and physiological                            status; initial 15 minutes of intraservice time,                            patient age 44 years or older Diagnosis Code(s):        --- Professional ---                           Z12.11, Encounter for screening for malignant                            neoplasm of colon                           K57.30, Diverticulosis of large intestine without                            perforation or abscess without bleeding CPT copyright 2016 [employer]. All rights  reserved. The codes documented in this report are preliminary and upon coder review may  be revised to meet current compliance requirements. Gerrit Friends. Rourk, MD Gennette Pac, MD 08/11/2016 1:08:05 PM This report has been signed electronically. Number of Addenda: 0

## 2016-08-12 ENCOUNTER — Encounter (HOSPITAL_COMMUNITY): Payer: Self-pay | Admitting: Internal Medicine

## 2016-12-25 ENCOUNTER — Other Ambulatory Visit: Payer: Self-pay | Admitting: Family Medicine

## 2016-12-28 NOTE — Telephone Encounter (Signed)
Medication refill for one time only.  Patient needs to be seen.  Letter sent for patient to call and schedule 

## 2017-02-08 ENCOUNTER — Other Ambulatory Visit: Payer: Self-pay | Admitting: Family Medicine

## 2017-06-24 ENCOUNTER — Ambulatory Visit (INDEPENDENT_AMBULATORY_CARE_PROVIDER_SITE_OTHER): Payer: Managed Care, Other (non HMO) | Admitting: Physician Assistant

## 2017-06-24 ENCOUNTER — Encounter: Payer: Self-pay | Admitting: Physician Assistant

## 2017-06-24 VITALS — BP 132/70 | HR 77 | Temp 97.6°F | Resp 20 | Ht 68.0 in | Wt 245.0 lb

## 2017-06-24 DIAGNOSIS — R5381 Other malaise: Secondary | ICD-10-CM | POA: Diagnosis not present

## 2017-06-24 DIAGNOSIS — J988 Other specified respiratory disorders: Secondary | ICD-10-CM

## 2017-06-24 DIAGNOSIS — R351 Nocturia: Secondary | ICD-10-CM | POA: Diagnosis not present

## 2017-06-24 DIAGNOSIS — R509 Fever, unspecified: Secondary | ICD-10-CM | POA: Diagnosis not present

## 2017-06-24 DIAGNOSIS — R5383 Other fatigue: Secondary | ICD-10-CM

## 2017-06-24 DIAGNOSIS — B9689 Other specified bacterial agents as the cause of diseases classified elsewhere: Secondary | ICD-10-CM

## 2017-06-24 LAB — URINALYSIS, ROUTINE W REFLEX MICROSCOPIC
BILIRUBIN URINE: NEGATIVE
Bacteria, UA: NONE SEEN /HPF
Glucose, UA: NEGATIVE
Leukocytes, UA: NEGATIVE
NITRITE: NEGATIVE
PH: 6 (ref 5.0–8.0)
SPECIFIC GRAVITY, URINE: 1.025 (ref 1.001–1.03)
SQUAMOUS EPITHELIAL / LPF: NONE SEEN /HPF (ref <=5)
WBC, UA: NONE SEEN /HPF (ref 0–5)

## 2017-06-24 LAB — MICROSCOPIC MESSAGE

## 2017-06-24 MED ORDER — AZITHROMYCIN 250 MG PO TABS: ORAL_TABLET | ORAL | 0 refills | Status: DC

## 2017-06-24 NOTE — Progress Notes (Signed)
Patient ID: Larry Fleming MRN: [id number], DOB: 02/28/1956, 61 y.o. Date of Encounter: 06/24/2017, 9:25 AM    Chief Complaint:  Chief Complaint  Patient presents with  . Fever, chills, feels bad, achy all over     HPI: 61 y.o. year old male presents with above.   His wife accompanies him for visit today. Reports that he started feeling bad on Monday 06/21/17. Says when he woke up that morning he felt like he had a fever and also had no energy. Reports that on Tuesday he had no energy at all. Reports that on Wednesday morning (yesterday) he went to work and stayed all day at that job and then even went to his part-time job that night. States that he didn't sleep very well. Seem like he woke up every hour and ended up going to the bathroom to urinate. Says that he has had no dysuria and has felt no pressure or discomfort in his pelvic prostate region. They have no thermometer at home so he did not actually check his temperature.  Wife confirms that he definitely felt hot and even was sweaty. Pt and wife both report that he has been having significant cough.  Patient states he has thick dark phlegm and feels that in his throat.  Has not been blowing much out of his nose.  Has had no abdominal pain and no vomiting no diarrhea.      Home Meds:   Outpatient Medications Prior to Visit  Medication Sig Dispense Refill  . allopurinol (ZYLOPRIM) 100 MG tablet TAKE 2 TABLETS BY MOUTH EVERY DAY 60 tablet 0  . aspirin EC 81 MG tablet Take 81 mg by mouth daily.    . Na Sulfate-K Sulfate-Mg Sulf (SUPREP BOWEL PREP KIT) 17.5-3.13-1.6 GM/180ML SOLN Take 1 kit by mouth as directed. 1 Bottle 0  . naproxen sodium (ALEVE) 220 MG tablet Take 220 mg by mouth 2 (two) times daily with a meal.    . sildenafil (VIAGRA) 100 MG tablet Take 0.5-1 tablets (50-100 mg total) by mouth daily as needed for erectile dysfunction. 10 tablet 3  . tamsulosin (FLOMAX) 0.4 MG CAPS capsule Take 0.4 mg by mouth at bedtime.      . Testosterone (ANDROGEL PUMP) 20.25 MG/ACT (1.62%) GEL PLACE 3 PUMPS ONTO THE SKIN EVERY DAY AS DIRECTED (Patient taking differently: Apply 4 Squirts topically as directed. PLACE 4 PUMPS ONTO THE SKIN EVERY DAY (2 PUMPS APPLIED TO EACH SHOULDER) AS DIRECTED) 75 g 2   No facility-administered medications prior to visit.     Allergies: No Known Allergies    Review of Systems: See HPI for pertinent ROS. All other ROS negative.    Physical Exam: Blood pressure 132/70, pulse 77, temperature 97.6 F (36.4 C), temperature source Oral, resp. rate 20, height _0  (1.727 m), weight 245 lb (111.1 kg), SpO2 96 %., Body mass index is 37.25 kg/m. General: AAM.  Appears in no acute distress. HEENT: Normocephalic, atraumatic, eyes without discharge, sclera non-icteric, nares are without discharge. Bilateral auditory canals clear, TM's are without perforation, pearly grey and translucent with reflective cone of light bilaterally. Oral cavity moist, posterior pharynx without exudate, erythema, peritonsillar abscess. No tenderness with percussion to frontal or maxillary sinuses bilaterally.  Neck: Supple. No thyromegaly. No lymphadenopathy. Lungs: Clear bilaterally to auscultation without wheezes, rales, or rhonchi. Breathing is unlabored. Heart: Regular rhythm. No murmurs, rubs, or gallops. Msk:  Strength and tone normal for age. Extremities/Skin: Warm and dry.  Neuro: Alert  and oriented X 3. Moves all extremities spontaneously. Gait is normal. CNII-XII grossly in tact. Psych:  Responds to questions appropriately with a normal affect.   UA shows no sign of infection.   ASSESSMENT AND PLAN:  61 y.o. year old male with  1. Nocturia - Urinalysis, Routine w reflex microscopic  2. Malaise and fatigue - Urinalysis, Routine w reflex microscopic  3. Fever, unspecified fever cause - Urinalysis, Routine w reflex microscopic  4. Bacterial respiratory infection Reviewed urinalysis results with  patient and wife.  Discussed that bacterial respiratory infection is cause of his symptoms.  He is to take antibiotic as directed.  Follow-up if symptoms worsen significantly or do not resolve within 1 week after completion of antibiotic. He works a main job and then also an additional part-time job. I have given him a note to turn in to the main job to be out today and tomorrow. I given him a note to stay out of the other job for today and plans to return there Monday. - azithromycin (ZITHROMAX) 250 MG tablet; Day 1: Take 2 daily. Days 2 - 5: Take 1 daily.  Dispense: 6 tablet; Refill: 0   Signed, [address] [place], [place], [facility] 06/24/2017 9:25 AM

## 2017-06-29 ENCOUNTER — Encounter: Payer: Self-pay | Admitting: Family Medicine

## 2017-06-29 ENCOUNTER — Ambulatory Visit (INDEPENDENT_AMBULATORY_CARE_PROVIDER_SITE_OTHER): Payer: Managed Care, Other (non HMO) | Admitting: Family Medicine

## 2017-06-29 VITALS — BP 130/78 | HR 78 | Temp 98.8°F | Resp 14 | Ht 68.0 in | Wt 246.0 lb

## 2017-06-29 DIAGNOSIS — Z111 Encounter for screening for respiratory tuberculosis: Secondary | ICD-10-CM | POA: Diagnosis not present

## 2017-06-29 DIAGNOSIS — M1A00X1 Idiopathic chronic gout, unspecified site, with tophus (tophi): Secondary | ICD-10-CM | POA: Diagnosis not present

## 2017-06-29 DIAGNOSIS — M109 Gout, unspecified: Secondary | ICD-10-CM | POA: Diagnosis not present

## 2017-06-29 DIAGNOSIS — M1A9XX1 Chronic gout, unspecified, with tophus (tophi): Secondary | ICD-10-CM

## 2017-06-29 MED ORDER — HYDROCODONE-ACETAMINOPHEN 5-325 MG PO TABS: 1.0000 | ORAL_TABLET | Freq: Four times a day (QID) | ORAL | 0 refills | Status: DC | PRN

## 2017-06-29 MED ORDER — ALLOPURINOL 100 MG PO TABS: 200.0000 mg | ORAL_TABLET | Freq: Every day | ORAL | 6 refills | Status: DC

## 2017-06-29 MED ORDER — PREDNISONE 10 MG PO TABS: ORAL_TABLET | ORAL | 0 refills | Status: DC

## 2017-06-29 MED ORDER — METHYLPREDNISOLONE ACETATE 80 MG/ML IJ SUSP
80.0000 mg | Freq: Once | INTRAMUSCULAR | Status: AC
  Administered 2017-06-29: 80 mg via INTRAMUSCULAR

## 2017-06-29 NOTE — Progress Notes (Signed)
   Subjective:    Patient ID: Larry Fleming, male    DOB: [birth year], 61 y.o.   MRN: [id number]  Patient presents for Joint Pain (x3 days- swelling and pain to L wirst, L elbow and L knee- bursitis to L elbow/ L knee)  Was sick last week with URI symptoms, seen on 9/13, work up Sunday with numbness and swelling of fingers, elbow and left knee  Ran out of allopurinol 4 months ago. His symptoms started over the weekend he had some numbness in the swelling in his hand in his elbow swelled up then the right knee. This feels like his previous gout flares. He has not had any recent fever no injury. The pain is keeping him up at night. He missed the evenings and foods that typically bring on his gout flares.   Review Of Systems:  GEN- denies fatigue, fever, weight loss,weakness, recent illness HEENT- denies eye drainage, change in vision, nasal discharge, CVS- denies chest pain, palpitations RESP- denies SOB, cough, wheeze ABD- denies N/V, change in stools, abd pain GU- denies dysuria, hematuria, dribbling, incontinence MSK +joint pain, muscle aches, injury Neuro- denies headache, dizziness, syncope, seizure activity       Objective:    BP 130/78   Pulse 78   Temp 98.8 F (37.1 C) (Oral)   Resp 14   Ht  (1.727 m)   Wt 246 lb (111.6 kg)   SpO2 96%   BMI 37.40 kg/m  GEN- NAD, alert and oriented x3 CVS- RRR, no murmur RESP-CTAB EXT- Gouty tophi left knee, mild warmth no erythema, Left elbow tophi, TTP, swelling to wrist, thumb/ 3-4th digits left hand, unable to make fist  Pulses- Radial, DP- 2+        Assessment & Plan:      Problem List Items Addressed This Visit      Unprioritized   Gouty tophi of joint - Primary    Acute gouty attack with tophi in his arm and his knee. I given him Depo-Medrol 80 mg injection for severe sepsis is think that we need steroids. He's had the steroid in the past. Since he has a  severe reaction related to need high-dose steroid, to go  ahead and restart the allopurinol. I am to check his uric acid and CBC metabolic today. He has been given hydrocodone to use at bedtime for pain. 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 If this does not resolve the issue I will send him to orthopedics to have the fluid drawn off.      Relevant Medications   allopurinol (ZYLOPRIM) 100 MG tablet   HYDROcodone-acetaminophen (NORCO) 5-325 MG tablet   predniSONE (DELTASONE) 10 MG tablet   methylPREDNISolone acetate (DEPO-MEDROL) injection 80 mg (Completed)   Other Relevant Orders   CBC with Differential/Platelet   Basic metabolic panel   Uric Acid    Other Visit Diagnoses    Acute gout of multiple sites, unspecified cause       Relevant Medications   allopurinol (ZYLOPRIM) 100 MG tablet   HYDROcodone-acetaminophen (NORCO) 5-325 MG tablet   predniSONE (DELTASONE) 10 MG tablet   methylPREDNISolone acetate (DEPO-MEDROL) injection 80 mg (Completed)      Note: This dictation was prepared with Dragon dictation along with smaller phrase technology. Any transcriptional errors that result from this process are unintentional.

## 2017-06-29 NOTE — Assessment & Plan Note (Signed)
Acute gouty attack with tophi in his arm and his knee. I given him Depo-Medrol 80 mg injection for severe sepsis is think that we need steroids. He's had the steroid in the past. Since he has a  severe reaction related to need high-dose steroid, to go ahead and restart the allopurinol. I am to check his uric acid and CBC metabolic today. He has been given hydrocodone to use at bedtime for pain. 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 If this does not resolve the issue I will send him to orthopedics to have the fluid drawn off.

## 2017-06-29 NOTE — Patient Instructions (Addendum)
Take steroid taper Take pain medication at bedtime Restart the allopurinol Schedule a physical  Low-Purine Diet Purines are compounds that affect the level of uric acid in your body. A low-purine diet is a diet that is low in purines. Eating a low-purine diet can prevent the level of uric acid in your body from getting too high and causing gout or kidney stones or both. What do I need to know about this diet?  Choose low-purine foods. Examples of low-purine foods are listed in the next section.  Drink plenty of fluids, especially water. Fluids can help remove uric acid from your body. Try to drink 8-16 cups (1.9-3.8 L) a day.  Limit foods high in fat, especially saturated fat, as fat makes it harder for the body to get rid of uric acid. Foods high in saturated fat include pizza, cheese, ice cream, whole milk, fried foods, and gravies. Choose foods that are lower in fat and lean sources of protein. Use olive oil when cooking as it contains healthy fats that are not high in saturated fat.  Limit alcohol. Alcohol interferes with the elimination of uric acid from your body. If you are having a gout attack, avoid all alcohol.  Keep in mind that different people's bodies react differently to different foods. You will probably learn over time which foods do or do not affect you. If you discover that a food tends to cause your gout to flare up, avoid eating that food. You can more freely enjoy foods that do not cause problems. If you have any questions about a food item, talk to your dietitian or health care provider. Which foods are low, moderate, and high in purines? The following is a list of foods that are low, moderate, and high in purines. You can eat any amount of the foods that are low in purines. You may be able to have small amounts of foods that are moderate in purines. Ask your health care provider how much of a food moderate in purines you can have. Avoid foods high in  purines. Grains  Foods low in purines: Enriched white bread, pasta, rice, cake, cornbread, popcorn.  Foods moderate in purines: Whole-grain breads and cereals, wheat germ, bran, oatmeal. Uncooked oatmeal. Dry wheat bran or wheat germ.  Foods high in purines: Pancakes, [place] toast, biscuits, muffins. Vegetables  Foods low in purines: All vegetables, except those that are moderate in purines.  Foods moderate in purines: Asparagus, cauliflower, spinach, mushrooms, green peas. Fruits  All fruits are low in purines. Meats and other Protein Foods  Foods low in purines: Eggs, nuts, peanut butter.  Foods moderate in purines: 80-90% lean beef, lamb, veal, pork, poultry, fish, eggs, peanut butter, nuts. Crab, lobster, oysters, and shrimp. Cooked dried beans, peas, and lentils.  Foods high in purines: Anchovies, sardines, herring, mussels, tuna, codfish, scallops, trout, and haddock. Larry Fleming. Organ meats (such as liver or kidney). Tripe. Game meat. Goose. Sweetbreads. Dairy  All dairy foods are low in purines. Low-fat and fat-free dairy products are best because they are low in saturated fat. Beverages  Drinks low in purines: Water, carbonated beverages, tea, coffee, cocoa.  Drinks moderate in purines: Soft drinks and other drinks sweetened with high-fructose corn syrup. Juices. To find whether a food or drink is sweetened with high-fructose corn syrup, look at the ingredients list.  Drinks high in purines: Alcoholic beverages (such as beer). Condiments  Foods low in purines: Salt, herbs, olives, pickles, relishes, vinegar.  Foods moderate in purines: Butter,  margarine, oils, mayonnaise. Fats and Oils  Foods low in purines: All types, except gravies and sauces made with meat.  Foods high in purines: Gravies and sauces made with meat. Other Foods  Foods low in purines: Sugars, sweets, gelatin. Cake. Soups made without meat.  Foods moderate in purines: Meat-based or fish-based soups,  broths, or bouillons. Foods and drinks sweetened with high-fructose corn syrup.  Foods high in purines: High-fat desserts (such as ice cream, cookies, cakes, pies, doughnuts, and chocolate). Contact your dietitian for more information on foods that are not listed here. This information is not intended to replace advice given to you by your health care provider. Make sure you discuss any questions you have with your health care provider. Document Released: 01/23/2011 Document Revised: 03/05/2016 Document Reviewed: 09/04/2013 Elsevier Interactive Patient Education  2017 [employer].

## 2017-06-29 NOTE — Addendum Note (Signed)
Addended by: Phillips Odor on: 06/29/2017 05:04 PM   Modules accepted: Orders

## 2017-06-30 LAB — CBC WITH DIFFERENTIAL/PLATELET
BASOS ABS: 60 {cells}/uL (ref 0–200)
Basophils Relative: 0.4 %
EOS PCT: 2.5 %
Eosinophils Absolute: 378 cells/uL (ref 15–500)
HCT: 46.7 % (ref 38.5–50.0)
HEMOGLOBIN: 15.1 g/dL (ref 13.2–17.1)
Lymphs Abs: 1616 cells/uL (ref 850–3900)
MCH: 24.6 pg — AB (ref 27.0–33.0)
MCHC: 32.3 g/dL (ref 32.0–36.0)
MCV: 76.1 fL — ABNORMAL LOW (ref 80.0–100.0)
MPV: 10.5 fL (ref 7.5–12.5)
Monocytes Relative: 10.1 %
NEUTROS ABS: 11521 {cells}/uL — AB (ref 1500–7800)
Neutrophils Relative %: 76.3 %
PLATELETS: 346 10*3/uL (ref 140–400)
RBC: 6.14 10*6/uL — ABNORMAL HIGH (ref 4.20–5.80)
RDW: 18.1 % — ABNORMAL HIGH (ref 11.0–15.0)
TOTAL LYMPHOCYTE: 10.7 %
WBC mixed population: 1525 cells/uL — ABNORMAL HIGH (ref 200–950)
WBC: 15.1 10*3/uL — ABNORMAL HIGH (ref 3.8–10.8)

## 2017-06-30 LAB — BASIC METABOLIC PANEL WITH GFR
BUN: 16 mg/dL (ref 7–25)
CO2: 22 mmol/L (ref 20–32)
Calcium: 8.9 mg/dL (ref 8.6–10.3)
Chloride: 103 mmol/L (ref 98–110)
Creat: 1.22 mg/dL (ref 0.70–1.25)
Glucose, Bld: 93 mg/dL (ref 65–99)
Potassium: 4.4 mmol/L (ref 3.5–5.3)
Sodium: 137 mmol/L (ref 135–146)

## 2017-06-30 LAB — URIC ACID: URIC ACID, SERUM: 9.3 mg/dL — AB (ref 4.0–8.0)

## 2017-07-01 ENCOUNTER — Ambulatory Visit: Payer: Managed Care, Other (non HMO) | Admitting: Family Medicine

## 2017-07-01 DIAGNOSIS — Z111 Encounter for screening for respiratory tuberculosis: Secondary | ICD-10-CM

## 2017-07-01 LAB — TB SKIN TEST
Induration: 0 mm
TB SKIN TEST: NEGATIVE

## 2017-07-01 NOTE — Patient Instructions (Signed)
PPD TB skin test was negative.  No redness or induration.  Pt given his results.

## 2017-07-05 ENCOUNTER — Ambulatory Visit: Payer: Managed Care, Other (non HMO)

## 2017-07-14 ENCOUNTER — Encounter: Payer: Self-pay | Admitting: Family Medicine

## 2017-07-14 ENCOUNTER — Ambulatory Visit (INDEPENDENT_AMBULATORY_CARE_PROVIDER_SITE_OTHER): Payer: Managed Care, Other (non HMO) | Admitting: Family Medicine

## 2017-07-14 VITALS — BP 130/70 | HR 82 | Temp 98.4°F | Resp 14 | Ht 68.0 in | Wt 241.0 lb

## 2017-07-14 DIAGNOSIS — Z23 Encounter for immunization: Secondary | ICD-10-CM

## 2017-07-14 DIAGNOSIS — E782 Mixed hyperlipidemia: Secondary | ICD-10-CM | POA: Diagnosis not present

## 2017-07-14 DIAGNOSIS — Z0000 Encounter for general adult medical examination without abnormal findings: Secondary | ICD-10-CM

## 2017-07-14 DIAGNOSIS — M1A00X1 Idiopathic chronic gout, unspecified site, with tophus (tophi): Secondary | ICD-10-CM | POA: Diagnosis not present

## 2017-07-14 DIAGNOSIS — M1A9XX1 Chronic gout, unspecified, with tophus (tophi): Secondary | ICD-10-CM

## 2017-07-14 DIAGNOSIS — Z6836 Body mass index (BMI) 36.0-36.9, adult: Secondary | ICD-10-CM

## 2017-07-14 MED ORDER — PREDNISONE 10 MG PO TABS: ORAL_TABLET | ORAL | 0 refills | Status: DC

## 2017-07-14 NOTE — Patient Instructions (Signed)
F/U  1 year for Physical Flu shot given  

## 2017-07-14 NOTE — Progress Notes (Signed)
   Subjective:    Patient ID: Larry Fleming, male    DOB: 1956-09-28, 61 y.o.   MRN: [id number]  Patient presents for Annual Exam and Flu Vaccine  Pt here for wellness, recently seen for gout attack now much improved. He is also back on allopuinol. No new concerns today  Colonoscopy UTD TB test negative Tetanus UTD/ Declines shingles Flu shot due  [facility]- seen by Dr. Odis Luster 3 months ago, states PSA was normal   Review Of Systems:  GEN- denies fatigue, fever, weight loss,weakness, recent illness HEENT- denies eye drainage, change in vision, nasal discharge, CVS- denies chest pain, palpitations RESP- denies SOB, cough, wheeze ABD- denies N/V, change in stools, abd pain GU- denies dysuria, hematuria, dribbling, incontinence MSK- denies joint pain, muscle aches, injury Neuro- denies headache, dizziness, syncope, seizure activity       Objective:    BP 130/70   Pulse 82   Temp 98.4 F (36.9 C) (Oral)   Resp 14   Ht  (1.727 m)   Wt 241 lb (109.3 kg)   SpO2 97%   BMI 36.64 kg/m  GEN- NAD, alert and oriented x3 HEENT- PERRL, EOMI, non injected sclera, pink conjunctiva, MMM, oropharynx clear Neck- Supple, no thyromegaly CVS- RRR, no murmur RESP-CTAB ABD-NABS,soft,NT,ND MSK- Tophi bilat elbows, knees, mild swelling in Left hand 3rd digit at MIP  EXT- No edema Pulses- Radial, DP- 2+        Assessment & Plan:      Problem List Items Addressed This Visit      Unprioritized   Obesity   Hyperlipidemia, mixed    Check fasting labs He continues to work on weight loss      Relevant Orders   Lipid panel (Completed)   Gouty tophi of joint    Continue allopurinol I also gave him a refill on steroid taper in case he flares back up getting back on allopurinol      Relevant Medications   predniSONE (DELTASONE) 10 MG tablet    Other Visit Diagnoses    Routine general medical examination at a health care facility    -  Primary   CPE done, flu shot  given, form completed for his work   Relevant Orders   CBC with Differential/Platelet (Completed)   Comprehensive metabolic panel (Completed)   Needs flu shot       Relevant Orders   Flu Vaccine QUAD 36+ mos IM (Completed)      Note: This dictation was prepared with Dragon dictation along with smaller phrase technology. Any transcriptional errors that result from this process are unintentional.

## 2017-07-15 LAB — CBC WITH DIFFERENTIAL/PLATELET
BASOS ABS: 36 {cells}/uL (ref 0–200)
Basophils Relative: 0.3 %
EOS PCT: 1.9 %
Eosinophils Absolute: 228 cells/uL (ref 15–500)
HEMATOCRIT: 42.8 % (ref 38.5–50.0)
HEMOGLOBIN: 14.1 g/dL (ref 13.2–17.1)
LYMPHS ABS: 1716 {cells}/uL (ref 850–3900)
MCH: 24.9 pg — ABNORMAL LOW (ref 27.0–33.0)
MCHC: 32.9 g/dL (ref 32.0–36.0)
MCV: 75.6 fL — ABNORMAL LOW (ref 80.0–100.0)
MPV: 10.4 fL (ref 7.5–12.5)
Monocytes Relative: 9.2 %
NEUTROS ABS: 8916 {cells}/uL — AB (ref 1500–7800)
Neutrophils Relative %: 74.3 %
Platelets: 405 10*3/uL — ABNORMAL HIGH (ref 140–400)
RBC: 5.66 10*6/uL (ref 4.20–5.80)
RDW: 16.8 % — AB (ref 11.0–15.0)
Total Lymphocyte: 14.3 %
WBC: 12 10*3/uL — ABNORMAL HIGH (ref 3.8–10.8)
WBCMIX: 1104 {cells}/uL — AB (ref 200–950)

## 2017-07-15 LAB — COMPREHENSIVE METABOLIC PANEL
AG RATIO: 1.2 (calc) (ref 1.0–2.5)
ALBUMIN MSPROF: 3.6 g/dL (ref 3.6–5.1)
ALT: 16 U/L (ref 9–46)
AST: 17 U/L (ref 10–35)
Alkaline phosphatase (APISO): 68 U/L (ref 40–115)
BILIRUBIN TOTAL: 0.3 mg/dL (ref 0.2–1.2)
BUN/Creatinine Ratio: 16 (calc) (ref 6–22)
BUN: 21 mg/dL (ref 7–25)
CALCIUM: 8.9 mg/dL (ref 8.6–10.3)
CO2: 20 mmol/L (ref 20–32)
Chloride: 106 mmol/L (ref 98–110)
Creat: 1.3 mg/dL — ABNORMAL HIGH (ref 0.70–1.25)
GLOBULIN: 3.1 g/dL (ref 1.9–3.7)
Glucose, Bld: 84 mg/dL (ref 65–99)
POTASSIUM: 4.6 mmol/L (ref 3.5–5.3)
SODIUM: 139 mmol/L (ref 135–146)
TOTAL PROTEIN: 6.7 g/dL (ref 6.1–8.1)

## 2017-07-15 LAB — LIPID PANEL
Cholesterol: 168 mg/dL (ref <200)
HDL: 26 mg/dL — ABNORMAL LOW (ref >40)
LDL CHOLESTEROL (CALC): 112 mg/dL — AB
Non-HDL Cholesterol (Calc): 142 mg/dL (calc) — ABNORMAL HIGH (ref <130)
Total CHOL/HDL Ratio: 6.5 (calc) — ABNORMAL HIGH (ref <5.0)
Triglycerides: 188 mg/dL — ABNORMAL HIGH (ref <150)

## 2017-07-15 LAB — SPECIMEN COMPROMISED

## 2017-07-15 NOTE — Assessment & Plan Note (Signed)
Continue allopurinol I also gave him a refill on steroid taper in case he flares back up getting back on allopurinol

## 2017-07-15 NOTE — Assessment & Plan Note (Signed)
Check fasting labs He continues to work on weight loss

## 2017-07-19 ENCOUNTER — Other Ambulatory Visit: Payer: Self-pay | Admitting: Family Medicine

## 2017-07-19 DIAGNOSIS — Z79899 Other long term (current) drug therapy: Secondary | ICD-10-CM

## 2017-07-19 DIAGNOSIS — E782 Mixed hyperlipidemia: Secondary | ICD-10-CM

## 2017-07-20 ENCOUNTER — Other Ambulatory Visit: Payer: Managed Care, Other (non HMO)

## 2017-07-20 DIAGNOSIS — E782 Mixed hyperlipidemia: Secondary | ICD-10-CM

## 2017-07-20 DIAGNOSIS — Z79899 Other long term (current) drug therapy: Secondary | ICD-10-CM

## 2017-07-21 LAB — COMPREHENSIVE METABOLIC PANEL
AG Ratio: 1.2 (calc) (ref 1.0–2.5)
ALBUMIN MSPROF: 3.4 g/dL — AB (ref 3.6–5.1)
ALT: 18 U/L (ref 9–46)
AST: 18 U/L (ref 10–35)
Alkaline phosphatase (APISO): 66 U/L (ref 40–115)
BUN / CREAT RATIO: 10 (calc) (ref 6–22)
BUN: 13 mg/dL (ref 7–25)
CHLORIDE: 106 mmol/L (ref 98–110)
CO2: 21 mmol/L (ref 20–32)
Calcium: 8.6 mg/dL (ref 8.6–10.3)
Creat: 1.3 mg/dL — ABNORMAL HIGH (ref 0.70–1.25)
GLOBULIN: 2.8 g/dL (ref 1.9–3.7)
Glucose, Bld: 103 mg/dL — ABNORMAL HIGH (ref 65–99)
POTASSIUM: 4.1 mmol/L (ref 3.5–5.3)
SODIUM: 137 mmol/L (ref 135–146)
TOTAL PROTEIN: 6.2 g/dL (ref 6.1–8.1)
Total Bilirubin: 0.3 mg/dL (ref 0.2–1.2)

## 2017-09-22 ENCOUNTER — Encounter: Payer: Self-pay | Admitting: Family Medicine

## 2017-10-14 ENCOUNTER — Other Ambulatory Visit: Payer: Self-pay | Admitting: Family Medicine

## 2017-11-04 ENCOUNTER — Encounter: Payer: Self-pay | Admitting: Family Medicine

## 2017-11-24 ENCOUNTER — Ambulatory Visit (INDEPENDENT_AMBULATORY_CARE_PROVIDER_SITE_OTHER): Payer: Managed Care, Other (non HMO) | Admitting: Family Medicine

## 2017-11-24 ENCOUNTER — Encounter: Payer: Self-pay | Admitting: Family Medicine

## 2017-11-24 ENCOUNTER — Other Ambulatory Visit: Payer: Self-pay

## 2017-11-24 VITALS — BP 128/64 | HR 66 | Temp 99.0°F | Resp 16 | Ht 68.0 in | Wt 243.0 lb

## 2017-11-24 DIAGNOSIS — J069 Acute upper respiratory infection, unspecified: Secondary | ICD-10-CM

## 2017-11-24 LAB — INFLUENZA A AND B AG, IMMUNOASSAY
INFLUENZA A ANTIGEN: NOT DETECTED
INFLUENZA B ANTIGEN: NOT DETECTED

## 2017-11-24 MED ORDER — TAMSULOSIN HCL 0.4 MG PO CAPS: 0.4000 mg | ORAL_CAPSULE | Freq: Every day | ORAL | 11 refills | Status: DC

## 2017-11-24 NOTE — Patient Instructions (Addendum)
Take Mucinex DM Use nasal saline Plenty of fluids Ibuprofen or aleve for any headaches F/U a sneeded

## 2017-11-24 NOTE — Progress Notes (Signed)
   Subjective:    Patient ID: Larry Fleming, male    DOB: 1955-12-04, 62 y.o.   MRN: [id number]  Patient presents for Illness (x1 day- fevr, chest congestion, muscle aches, HA)   Yesterday was at work started feeling bad, headache, muscle aches,, subjective fever, cough with congestion. No sinus drainage.   Took theraflu last yesterday. No OTC meds this morning   works at school in the evening, multiple sick contacts No vomiting or diarrhea  Has had flu shot     Review Of Systems:  GEN- denies fatigue, fever, weight loss,weakness, recent illness HEENT- denies eye drainage, change in vision, nasal discharge, CVS- denies chest pain, palpitations RESP- denies SOB,+ cough, wheeze ABD- denies N/V, change in stools, abd pain GU- denies dysuria, hematuria, dribbling, incontinence MSK- denies joint pain, muscle aches, injury Neuro- denies headache, dizziness, syncope, seizure activity       Objective:    BP 128/64   Pulse 66   Temp 99 F (37.2 C) (Oral)   Resp 16   Ht 5\' 8"  (1.727 m)   Wt 243 lb (110.2 kg)   SpO2 99%   BMI 36.95 kg/m  GEN- NAD, alert and oriented x3, non toxic appearing HEENT- PERRL, EOMI, non injected sclera, pink conjunctiva, MMM, oropharynx clear, TM clear bilat no effusion, no  maxillary sinus tenderness, + clear  Nasal drainage  Neck- Supple, no LAD CVS- RRR, no murmur RESP-CTAB EXT- No edema Pulses- Radial 2+         Assessment & Plan:      Problem List Items Addressed This Visit    None    Visit Diagnoses    Viral URI    -  Primary   Treat symptomatically, mucinex DM, nasal saline, fluids, rest. NSAIDS. Flu neg, he is non toxic appearing, expect symptoms to progress over next 2 days for typical viral infections   Relevant Orders   Influenza A and B Ag, Immunoassay      Note: This dictation was prepared with Dragon dictation along with smaller phrase technology. Any transcriptional errors that result from this process are unintentional.

## 2018-01-24 ENCOUNTER — Other Ambulatory Visit: Payer: Self-pay | Admitting: Family Medicine

## 2018-02-02 ENCOUNTER — Other Ambulatory Visit: Payer: Self-pay | Admitting: Family Medicine

## 2018-02-04 ENCOUNTER — Telehealth: Payer: Self-pay | Admitting: Family Medicine

## 2018-02-04 MED ORDER — ALLOPURINOL 100 MG PO TABS: 200.0000 mg | ORAL_TABLET | Freq: Every day | ORAL | 6 refills | Status: DC

## 2018-02-04 MED ORDER — CLOTRIMAZOLE 1 % EX CREA: 1.0000 "application " | TOPICAL_CREAM | Freq: Two times a day (BID) | CUTANEOUS | 0 refills | Status: DC

## 2018-02-04 NOTE — Telephone Encounter (Signed)
Patient is calling to see if he can get refill on his clotrimazole and his allopurinol  If possible  [facility] [place]

## 2018-02-04 NOTE — Telephone Encounter (Signed)
Prescription sent to pharmacy.

## 2018-06-23 ENCOUNTER — Other Ambulatory Visit: Payer: Self-pay | Admitting: *Deleted

## 2018-06-23 ENCOUNTER — Telehealth: Payer: Self-pay | Admitting: Family Medicine

## 2018-06-23 MED ORDER — PREDNISONE 10 MG PO TABS: ORAL_TABLET | ORAL | 0 refills | Status: DC

## 2018-06-23 NOTE — Telephone Encounter (Signed)
Prescription sent to pharmacy.

## 2018-06-23 NOTE — Telephone Encounter (Signed)
Patient has frequent exacerbation of Gouty tophi of joints.  He is currently taking Allopurinol 200mg  PO QD, but during flare requires steroids.   Patient generally uses Prednisone 10mg ~ Take 40mg  x 3 days, 20mg  x 3 days, 10mg  x 3 days, then stop. #21.   Ok to refill?

## 2018-06-23 NOTE — Telephone Encounter (Signed)
Approved.  

## 2018-06-23 NOTE — Telephone Encounter (Signed)
Pt is having a gout flare up would like for us to call in steroid for it to [facility] [address]

## 2018-06-24 NOTE — Telephone Encounter (Signed)
Call placed to patient and patient made aware per VM.  

## 2018-07-18 ENCOUNTER — Encounter: Payer: Managed Care, Other (non HMO) | Admitting: Family Medicine

## 2018-12-16 ENCOUNTER — Other Ambulatory Visit: Payer: Self-pay | Admitting: Family Medicine

## 2019-01-13 ENCOUNTER — Other Ambulatory Visit: Payer: Self-pay | Admitting: Family Medicine

## 2019-02-21 ENCOUNTER — Other Ambulatory Visit: Payer: Self-pay | Admitting: Family Medicine

## 2019-03-13 ENCOUNTER — Other Ambulatory Visit: Payer: Self-pay | Admitting: Family Medicine

## 2019-04-09 ENCOUNTER — Other Ambulatory Visit: Payer: Self-pay

## 2019-04-09 ENCOUNTER — Encounter (HOSPITAL_COMMUNITY): Payer: Self-pay

## 2019-04-09 ENCOUNTER — Emergency Department (HOSPITAL_COMMUNITY)
Admission: EM | Admit: 2019-04-09 | Discharge: 2019-04-09 | Disposition: A | Discharge status: Home / Self Care | Payer: Managed Care, Other (non HMO) | Attending: Emergency Medicine | Admitting: Emergency Medicine

## 2019-04-09 DIAGNOSIS — Z7982 Long term (current) use of aspirin: Secondary | ICD-10-CM | POA: Insufficient documentation

## 2019-04-09 DIAGNOSIS — Z87891 Personal history of nicotine dependence: Secondary | ICD-10-CM | POA: Insufficient documentation

## 2019-04-09 DIAGNOSIS — Z79899 Other long term (current) drug therapy: Secondary | ICD-10-CM | POA: Insufficient documentation

## 2019-04-09 DIAGNOSIS — M109 Gout, unspecified: Secondary | ICD-10-CM | POA: Insufficient documentation

## 2019-04-09 MED ORDER — PREDNISONE 10 MG PO TABS: ORAL_TABLET | ORAL | 0 refills | Status: DC

## 2019-04-09 MED ORDER — ALLOPURINOL 100 MG PO TABS: 200.0000 mg | ORAL_TABLET | Freq: Every day | ORAL | 0 refills | Status: DC

## 2019-04-09 NOTE — ED Triage Notes (Signed)
Pt is having a gout flareup in his right knee and left wrist. Pain with and without movement. Usually takes Allopurinol, but is not able to get an appointment for a refill.

## 2019-04-09 NOTE — Discharge Instructions (Addendum)
You were seen in the emergency department for pain in your right knee and left wrist.  This is likely gout and we are prescribing you some prednisone.  If your symptoms worsen or you experience a fever please return to the emergency department for reevaluation.

## 2019-04-09 NOTE — ED Provider Notes (Signed)
[facility] EMERGENCY DEPARTMENT Provider Note   CSN: [id number] Arrival date & time: 04/09/19  7425     History   Chief Complaint Chief Complaint  Patient presents with  . Gout    HPI Larry Fleming is a 63 y.o. male.  He is a history of gout.  He said he has been out of his allopurinol and started with right knee and left wrist pain for 2 days.  Said the pain was unbearable last night and he could not sleep.  He has had in his knee and wrist before but usually gets it in his feet.  There is been no fever.  The pain is worse with ambulation or movement.  He tried to reach his primary care doctor but was unable to get in touch with him.  He says they usually give him a steroid taper.  No trauma no other complaints.    The history is provided by the patient.  Knee Pain Location:  Knee Time since incident:  2 days Injury: no   Knee location:  R knee Pain details:    Quality:  Aching   Radiates to:  Does not radiate   Severity:  Severe   Onset quality:  Gradual   Timing:  Constant   Progression:  Worsening Chronicity:  Recurrent Foreign body present:  No foreign bodies Relieved by:  None tried Worsened by:  Bearing weight Ineffective treatments:  None tried Associated symptoms: decreased ROM and stiffness   Associated symptoms: no back pain and no fever     Past Medical History:  Diagnosis Date  . BPH (benign prostatic hypertrophy)   . Gout   . Hyperlipidemia   . Medical history non-contributory     Patient Active Problem List   Diagnosis Date Noted  . Gouty tophi of joint 01/10/2014  . Hyperlipidemia, mixed 07/24/2013  . Low testosterone 07/24/2013  . Obesity 07/12/2013  . BPH (benign prostatic hypertrophy) 07/12/2013  . Erectile dysfunction 07/12/2013    Past Surgical History:  Procedure Laterality Date  . CIRCUMCISION  30 years ago  . COLONOSCOPY N/A 08/11/2016   Procedure: COLONOSCOPY;  Surgeon: Daneil Dolin, MD;  Location: [facility];  Service:  Endoscopy;  Laterality: N/A;  1:15 PM        Home Medications    Prior to Admission medications   Medication Sig Start Date End Date Taking? Authorizing Provider  allopurinol (ZYLOPRIM) 100 MG tablet Take 2 tablets (200 mg total) by mouth daily. 02/04/18   Alycia Rossetti, MD  aspirin EC 81 MG tablet Take 81 mg by mouth daily.    [provider]  clotrimazole (LOTRIMIN) 1 % cream Apply 1 application topically 2 (two) times daily. 02/04/18   [place], Modena Nunnery, MD  naproxen sodium (ALEVE) 220 MG tablet Take 220 mg by mouth 2 (two) times daily with a meal.    [provider]  predniSONE (DELTASONE) 10 MG tablet Take 40mg  x 3 days, 20mg  x 3 days, 10mg  x 3 days, then stop. 06/23/18   Orlena Sheldon, PA-C  sildenafil (VIAGRA) 100 MG tablet Take 0.5-1 tablets (50-100 mg total) by mouth daily as needed for erectile dysfunction. 05/22/15   Alycia Rossetti, MD  tamsulosin (FLOMAX) 0.4 MG CAPS capsule TAKE 1 CAPSULE(0.4 MG) BY MOUTH AT BEDTIME 02/22/19   Alycia Rossetti, MD    Family History Family History  Problem Relation Age of Onset  . Diabetes Brother   . Stroke Brother   .  Cancer Brother        prostate  . Kidney disease Sister     Social History Social History   Tobacco Use  . Smoking status: Former Smoker    Packs/day: 1.00    Years: 4.00    Pack years: 4.00    Types: Cigarettes    Quit date: 02/09/1998    Years since quitting: 21.1  . Smokeless tobacco: Never Used  Substance Use Topics  . Alcohol use: No    Alcohol/week: 0.0 standard drinks  . Drug use: No     Allergies   Patient has no known allergies.   Review of Systems Review of Systems  Constitutional: Negative for fever.  HENT: Negative for sore throat.   Eyes: Negative for visual disturbance.  Respiratory: Negative for shortness of breath.   Cardiovascular: Negative for chest pain.  Gastrointestinal: Negative for abdominal pain.  Genitourinary: Negative for dysuria.  Musculoskeletal:  Positive for joint swelling and stiffness. Negative for back pain.  Skin: Negative for rash.  Neurological: Negative for numbness.     Physical Exam Updated Vital Signs BP (!) 142/79   Pulse 65   Temp 98.1 F (36.7 C) (Oral)   Resp 18   Ht 5\' 8"  (1.727 m)   Wt 115.2 kg   SpO2 98%   BMI 38.62 kg/m   Physical Exam Vitals signs and nursing note reviewed.  Constitutional:      Appearance: He is well-developed.  HENT:     Head: Normocephalic and atraumatic.  Eyes:     Conjunctiva/sclera: Conjunctivae normal.  Neck:     Musculoskeletal: Neck supple.  Cardiovascular:     Rate and Rhythm: Normal rate and regular rhythm.     Heart sounds: No murmur.  Pulmonary:     Effort: Pulmonary effort is normal. No respiratory distress.     Breath sounds: Normal breath sounds.  Abdominal:     Palpations: Abdomen is soft.     Tenderness: There is no abdominal tenderness.  Musculoskeletal: Normal range of motion.        General: Tenderness present.     Comments: Right knee shows a small prepatellar effusion.  No particular warmth.  No erythema.  He has full range of motion but it is uncomfortable for him.  There is some tenderness along the joint line.  Left wrist similarly has some diffuse tenderness but no particular swelling warmth or redness.  Other joints unaffected.  He is ambulatory with a limp.  Skin:    General: Skin is warm and dry.     Capillary Refill: Capillary refill takes less than 2 seconds.  Neurological:     General: No focal deficit present.     Mental Status: He is alert. Mental status is at baseline.      ED Treatments / Results  Labs (all labs ordered are listed, but only abnormal results are displayed) Labs Reviewed - No data to display  EKG    Radiology No results found.  Procedures Procedures (including critical care time)  Medications Ordered in ED Medications - No data to display   Initial Impression / Assessment and Plan / ED Course  I have  reviewed the triage vital signs and the nursing notes.  Pertinent labs & imaging results that were available during my care of the patient were reviewed by me and considered in my medical decision making (see chart for details).  Clinical Course as of Apr 08 844  Sun Apr 09, 2019  0820  63 year old male with history of gout off his allopurinol here with right knee and left wrist pain that he considers his gout.  Differential would also include septic joint although he is afebrile and there is no particular warmth or redness.  We will treat him with a steroid taper.   [MB]    Clinical Course User Index [MB] Terrilee FilesButler, Nicoli Nardozzi C, MD        Final Clinical Impressions(s) / ED Diagnoses   Final diagnoses:  Acute gout of multiple sites, unspecified cause    ED Discharge Orders         Ordered    allopurinol (ZYLOPRIM) 100 MG tablet  Daily     04/09/19 0821    predniSONE (DELTASONE) 10 MG tablet     04/09/19 [id number]           Terrilee FilesButler, Marketta Valadez C, MD 04/09/19 [phone]

## 2019-04-12 ENCOUNTER — Encounter: Payer: Self-pay | Admitting: Family Medicine

## 2019-04-12 ENCOUNTER — Other Ambulatory Visit: Payer: Self-pay

## 2019-04-12 ENCOUNTER — Ambulatory Visit (INDEPENDENT_AMBULATORY_CARE_PROVIDER_SITE_OTHER): Payer: Managed Care, Other (non HMO) | Admitting: Family Medicine

## 2019-04-12 VITALS — BP 134/80 | HR 78 | Temp 98.9°F | Resp 16 | Ht 68.0 in | Wt 253.0 lb

## 2019-04-12 DIAGNOSIS — Z6838 Body mass index (BMI) 38.0-38.9, adult: Secondary | ICD-10-CM

## 2019-04-12 DIAGNOSIS — E782 Mixed hyperlipidemia: Secondary | ICD-10-CM

## 2019-04-12 DIAGNOSIS — L918 Other hypertrophic disorders of the skin: Secondary | ICD-10-CM | POA: Diagnosis not present

## 2019-04-12 DIAGNOSIS — M1A9XX1 Chronic gout, unspecified, with tophus (tophi): Secondary | ICD-10-CM | POA: Diagnosis not present

## 2019-04-12 DIAGNOSIS — J301 Allergic rhinitis due to pollen: Secondary | ICD-10-CM

## 2019-04-12 MED ORDER — TAMSULOSIN HCL 0.4 MG PO CAPS: ORAL_CAPSULE | ORAL | 3 refills | Status: DC

## 2019-04-12 MED ORDER — LORATADINE 10 MG PO TABS: 10.0000 mg | ORAL_TABLET | Freq: Every day | ORAL | 3 refills | Status: DC | PRN

## 2019-04-12 MED ORDER — FLUTICASONE PROPIONATE 50 MCG/ACT NA SUSP: 2.0000 | Freq: Two times a day (BID) | NASAL | 1 refills | Status: DC | PRN

## 2019-04-12 MED ORDER — ALLOPURINOL 100 MG PO TABS: 200.0000 mg | ORAL_TABLET | Freq: Every day | ORAL | 6 refills | Status: DC

## 2019-04-12 MED ORDER — SILDENAFIL CITRATE 100 MG PO TABS: 50.0000 mg | ORAL_TABLET | Freq: Every day | ORAL | 3 refills | Status: DC | PRN

## 2019-04-12 NOTE — Assessment & Plan Note (Signed)
Check labs 

## 2019-04-12 NOTE — Patient Instructions (Signed)
F/U 4 months for physical We will call with lab results  

## 2019-04-12 NOTE — Assessment & Plan Note (Signed)
Flare off meds, also had trigger foods Now back on allopurinol Continue prednisone taper D/C NSAID

## 2019-04-12 NOTE — Progress Notes (Signed)
Subjective:    Patient ID: Larry Fleming, male    DOB: 08-08-1956, 63 y.o.   MRN: [id number]  Patient presents for ER F/U (gout flare- ran out of Allopurinol- was givne refill and pred taper at ER), Sinus Drainage (drainage down throat when he lays down at night), and Skin Tag (irritated skin tag to R side)   Pt here for ER follow up, was ou tof allopurinol for 2 months. Had recent flare in right knee and foot. He is currently on the prednisone taper, now on 20mg  of the taper.   He admits he has been eating red meat/shrimp   Sinus drainage for past few 2 weeks. He will cough up phlegm in the evening. No fever. Has been working gets temp checked daily. No persistant cough, no wheeze  He has used mucinex for congestion which helped a little   Last week- BP was elevated was  160/60,he had slight headache    BPH- he is on flomax  He has been taking aleve with the prednisone     Review Of Systems:  GEN- denies fatigue, fever, weight loss,weakness, recent illness HEENT- denies eye drainage, change in vision, +nasal discharge, CVS- denies chest pain, palpitations RESP- denies SOB, cough, wheeze ABD- denies N/V, change in stools, abd pain GU- denies dysuria, hematuria, dribbling, incontinence MSK- + joint pain, denies muscle aches, injury Neuro- denies headache, dizziness, syncope, seizure activity       Objective:    BP 134/80   Pulse 78   Temp 98.9 F (37.2 C) (Oral)   Resp 16   Ht 5\' 8"  (1.727 m)   Wt 253 lb (114.8 kg)   SpO2 97%   BMI 38.47 kg/m  GEN- NAD, alert and oriented x3 HEENT- PERRL, EOMI, non injected sclera, pink conjunctiva, MMM, oropharynx clear, nares clear rhinorrhea, enlarged turbinates, no maxillary sinus tenderness, TM clear no effusion Neck- Supple, no thyromegaly, no LAD CVS- RRR, no murmur RESP-CTAB ABD-NABS,soft,NT,ND Skin- Right abd side wall 2 skin tags, flesh toned MSK- Tophi right knee, mild warmth, no erythema, mild TTP, Decreased ROM  Right footm mild swelling great toe, antalgic gait, no erythema, no ulceration  EXT- No edema Pulses- Radial, DP- 2+   Procedure- Skin Tag Removal Procedure explained to patient questions answered benefits and risks discussed verbal consent obtained. Antiseptic-ETOH Anesthesia-Cold spray Both Tags clipped at base with scissors Minimal blood loss, drysol touched to lesion on neck and axilla due to persistent oozing Patient tolerated procedure well Bandage applied       Assessment & Plan:      Problem List Items Addressed This Visit      Unprioritized   Gouty tophi of joint - Primary    Flare off meds, also had trigger foods Now back on allopurinol Continue prednisone taper D/C NSAID      Relevant Medications   allopurinol (ZYLOPRIM) 100 MG tablet   Other Relevant Orders   Comprehensive metabolic panel   Uric Acid   Hyperlipidemia, mixed    Check labs      Relevant Medications   sildenafil (VIAGRA) 100 MG tablet   Other Relevant Orders   CBC with Differential/Platelet   Comprehensive metabolic panel   Lipid panel   Obesity    Other Visit Diagnoses    Allergic rhinitis due to pollen, unspecified seasonality       No sign of sinusitis, treat nasal spray and oral anti-histamine   Skin tag       benign  removed at bedside      Note: This dictation was prepared with Dragon dictation along with smaller phrase technology. Any transcriptional errors that result from this process are unintentional.

## 2019-04-13 ENCOUNTER — Other Ambulatory Visit: Payer: Self-pay | Admitting: *Deleted

## 2019-04-13 DIAGNOSIS — M1A9XX1 Chronic gout, unspecified, with tophus (tophi): Secondary | ICD-10-CM

## 2019-04-13 LAB — COMPREHENSIVE METABOLIC PANEL
AG Ratio: 1.3 (calc) (ref 1.0–2.5)
ALT: 17 U/L (ref 9–46)
AST: 14 U/L (ref 10–35)
Albumin: 3.8 g/dL (ref 3.6–5.1)
Alkaline phosphatase (APISO): 75 U/L (ref 35–144)
BUN: 18 mg/dL (ref 7–25)
CO2: 23 mmol/L (ref 20–32)
Calcium: 9.3 mg/dL (ref 8.6–10.3)
Chloride: 105 mmol/L (ref 98–110)
Creat: 1.18 mg/dL (ref 0.70–1.25)
Globulin: 2.9 g/dL (calc) (ref 1.9–3.7)
Glucose, Bld: 105 mg/dL — ABNORMAL HIGH (ref 65–99)
Potassium: 5.1 mmol/L (ref 3.5–5.3)
Sodium: 139 mmol/L (ref 135–146)
Total Bilirubin: 0.3 mg/dL (ref 0.2–1.2)
Total Protein: 6.7 g/dL (ref 6.1–8.1)

## 2019-04-13 LAB — LIPID PANEL
Cholesterol: 168 mg/dL (ref <200)
HDL: 28 mg/dL — ABNORMAL LOW (ref >=40)
LDL Cholesterol (Calc): 110 mg/dL (calc) — ABNORMAL HIGH
Non-HDL Cholesterol (Calc): 140 mg/dL (calc) — ABNORMAL HIGH (ref <130)
Total CHOL/HDL Ratio: 6 (calc) — ABNORMAL HIGH (ref <5.0)
Triglycerides: 180 mg/dL — ABNORMAL HIGH (ref <150)

## 2019-04-13 LAB — CBC WITH DIFFERENTIAL/PLATELET
Absolute Monocytes: 1550 cells/uL — ABNORMAL HIGH (ref 200–950)
Basophils Absolute: 82 cells/uL (ref 0–200)
Basophils Relative: 0.4 %
Eosinophils Absolute: 61 cells/uL (ref 15–500)
Eosinophils Relative: 0.3 %
HCT: 44.5 % (ref 38.5–50.0)
Hemoglobin: 14.7 g/dL (ref 13.2–17.1)
Lymphs Abs: 2244 cells/uL (ref 850–3900)
MCH: 25.6 pg — ABNORMAL LOW (ref 27.0–33.0)
MCHC: 33 g/dL (ref 32.0–36.0)
MCV: 77.4 fL — ABNORMAL LOW (ref 80.0–100.0)
MPV: 10.6 fL (ref 7.5–12.5)
Monocytes Relative: 7.6 %
Neutro Abs: 16463 cells/uL — ABNORMAL HIGH (ref 1500–7800)
Neutrophils Relative %: 80.7 %
Platelets: 477 10*3/uL — ABNORMAL HIGH (ref 140–400)
RBC: 5.75 10*6/uL (ref 4.20–5.80)
RDW: 17.5 % — ABNORMAL HIGH (ref 11.0–15.0)
Total Lymphocyte: 11 %
WBC: 20.4 10*3/uL — ABNORMAL HIGH (ref 3.8–10.8)

## 2019-04-13 LAB — URIC ACID: Uric Acid, Serum: 6.9 mg/dL (ref 4.0–8.0)

## 2019-04-19 ENCOUNTER — Other Ambulatory Visit: Payer: Managed Care, Other (non HMO)

## 2019-04-19 ENCOUNTER — Other Ambulatory Visit: Payer: Self-pay

## 2019-04-19 DIAGNOSIS — M1A9XX1 Chronic gout, unspecified, with tophus (tophi): Secondary | ICD-10-CM

## 2019-04-21 LAB — CBC WITH DIFFERENTIAL/PLATELET
Absolute Monocytes: 1504 cells/uL — ABNORMAL HIGH (ref 200–950)
Basophils Absolute: 48 cells/uL (ref 0–200)
Basophils Relative: 0.3 %
Eosinophils Absolute: 272 cells/uL (ref 15–500)
Eosinophils Relative: 1.7 %
HCT: 41.4 % (ref 38.5–50.0)
Hemoglobin: 13.7 g/dL (ref 13.2–17.1)
Lymphs Abs: 1712 cells/uL (ref 850–3900)
MCH: 25.8 pg — ABNORMAL LOW (ref 27.0–33.0)
MCHC: 33.1 g/dL (ref 32.0–36.0)
MCV: 78 fL — ABNORMAL LOW (ref 80.0–100.0)
MPV: 10.7 fL (ref 7.5–12.5)
Monocytes Relative: 9.4 %
Neutro Abs: 12464 cells/uL — ABNORMAL HIGH (ref 1500–7800)
Neutrophils Relative %: 77.9 %
Platelets: 434 10*3/uL — ABNORMAL HIGH (ref 140–400)
RBC: 5.31 10*6/uL (ref 4.20–5.80)
RDW: 17.9 % — ABNORMAL HIGH (ref 11.0–15.0)
Total Lymphocyte: 10.7 %
WBC: 16 10*3/uL — ABNORMAL HIGH (ref 3.8–10.8)

## 2019-04-21 LAB — PATHOLOGIST SMEAR REVIEW

## 2019-04-21 LAB — TEST AUTHORIZATION

## 2019-04-24 ENCOUNTER — Other Ambulatory Visit: Payer: Self-pay | Admitting: *Deleted

## 2019-04-24 DIAGNOSIS — D72829 Elevated white blood cell count, unspecified: Secondary | ICD-10-CM

## 2019-05-09 ENCOUNTER — Other Ambulatory Visit: Payer: Self-pay

## 2019-05-09 DIAGNOSIS — T161XXA Foreign body in right ear, initial encounter: Secondary | ICD-10-CM | POA: Insufficient documentation

## 2019-05-09 DIAGNOSIS — Y999 Unspecified external cause status: Secondary | ICD-10-CM | POA: Insufficient documentation

## 2019-05-09 DIAGNOSIS — Y939 Activity, unspecified: Secondary | ICD-10-CM | POA: Diagnosis not present

## 2019-05-09 DIAGNOSIS — X58XXXA Exposure to other specified factors, initial encounter: Secondary | ICD-10-CM | POA: Insufficient documentation

## 2019-05-09 DIAGNOSIS — Y929 Unspecified place or not applicable: Secondary | ICD-10-CM | POA: Diagnosis not present

## 2019-05-09 DIAGNOSIS — Z79899 Other long term (current) drug therapy: Secondary | ICD-10-CM | POA: Diagnosis not present

## 2019-05-09 DIAGNOSIS — Z7982 Long term (current) use of aspirin: Secondary | ICD-10-CM | POA: Insufficient documentation

## 2019-05-10 ENCOUNTER — Emergency Department (HOSPITAL_COMMUNITY)
Admission: EM | Admit: 2019-05-10 | Discharge: 2019-05-10 | Disposition: A | Discharge status: Home / Self Care | Payer: Managed Care, Other (non HMO) | Attending: Emergency Medicine | Admitting: Emergency Medicine

## 2019-05-10 ENCOUNTER — Other Ambulatory Visit: Payer: Self-pay

## 2019-05-10 ENCOUNTER — Encounter (HOSPITAL_COMMUNITY): Payer: Self-pay | Admitting: Emergency Medicine

## 2019-05-10 DIAGNOSIS — T161XXA Foreign body in right ear, initial encounter: Secondary | ICD-10-CM

## 2019-05-10 MED ORDER — MINERAL OIL PO OIL
TOPICAL_OIL | Freq: Once | ORAL | Status: DC
  Filled 2019-05-10 (×2): qty 30

## 2019-05-10 MED ORDER — OFLOXACIN 0.3 % OT SOLN: 5.0000 [drp] | Freq: Two times a day (BID) | OTIC | 0 refills | Status: AC

## 2019-05-10 MED ORDER — HYDROGEN PEROXIDE 3 % EX SOLN
Freq: Every day | CUTANEOUS | Status: DC
  Filled 2019-05-10: qty 473

## 2019-05-10 MED ORDER — OFLOXACIN 0.3 % OP SOLN
5.0000 [drp] | Freq: Every day | OPHTHALMIC | Status: DC
  Filled 2019-05-10: qty 5

## 2019-05-10 NOTE — Discharge Instructions (Signed)
We were not successful at removing all of the bug from your ear.  Part of it still remains.  We stopped removal attempts at your request. Use the antibiotic drops as prescribed and call Dr. Redmond Baseman tomorrow to schedule an office appointment. Return to the ED if you develop new or worsening symptoms.

## 2019-05-10 NOTE — ED Provider Notes (Signed)
[facility] Provider Note   CSN: [id number] Arrival date & time: 05/09/19  2358     History   Chief Complaint Chief Complaint  Patient presents with  . Foreign Body in Ear    HPI Larry Fleming is a 63 y.o. male.     Patient here with foreign body to right ear.  States he fell asleep and smacked a bug near his face which then went into his ear.  Complains of foreign body sensation to his right ear.  Denies any bleeding or drainage.  No fevers, chills, nausea or vomiting.  The history is provided by the patient.  Foreign Body in Ear Pertinent negatives include no chest pain, no abdominal pain, no headaches and no shortness of breath.    Past Medical History:  Diagnosis Date  . BPH (benign prostatic hypertrophy)   . Gout   . Hyperlipidemia   . Medical history non-contributory     Patient Active Problem List   Diagnosis Date Noted  . Gouty tophi of joint 01/10/2014  . Hyperlipidemia, mixed 07/24/2013  . Low testosterone 07/24/2013  . Obesity 07/12/2013  . BPH (benign prostatic hypertrophy) 07/12/2013  . Erectile dysfunction 07/12/2013    Past Surgical History:  Procedure Laterality Date  . CIRCUMCISION  30 years ago  . COLONOSCOPY N/A 08/11/2016   Procedure: COLONOSCOPY;  Surgeon: Corbin Adeobert M Rourk, MD;  Location: [facility];  Service: Endoscopy;  Laterality: N/A;  1:15 PM        Home Medications    Prior to Admission medications   Medication Sig Start Date End Date Taking? Authorizing Provider  allopurinol (ZYLOPRIM) 100 MG tablet Take 2 tablets (200 mg total) by mouth daily. 04/12/19   Salley Scarleturham, Kawanta F, MD  aspirin EC 81 MG tablet Take 81 mg by mouth daily.    [provider]  clotrimazole (LOTRIMIN) 1 % cream Apply 1 application topically 2 (two) times daily. 02/04/18   [place], Velna HatchetKawanta F, MD  fluticasone [facility](FLONASE) 50 MCG/ACT nasal spray Place 2 sprays into both nostrils 2 (two) times daily as needed for allergies or rhinitis.  04/12/19   [place], Velna HatchetKawanta F, MD  loratadine (CLARITIN) 10 MG tablet Take 1 tablet (10 mg total) by mouth daily as needed for allergies. 04/12/19   [place], Velna HatchetKawanta F, MD  naproxen sodium (ALEVE) 220 MG tablet Take 220 mg by mouth 2 (two) times daily with a meal.    [provider]  predniSONE (DELTASONE) 10 MG tablet Take 40mg  x 3 days, 20mg  x 3 days, 10mg  x 3 days, then stop. 04/09/19   Terrilee FilesButler, Michael C, MD  sildenafil (VIAGRA) 100 MG tablet Take 0.5-1 tablets (50-100 mg total) by mouth daily as needed for erectile dysfunction. 04/12/19   Salley Scarleturham, Kawanta F, MD  tamsulosin (FLOMAX) 0.4 MG CAPS capsule TAKE 1 CAPSULE(0.4 MG) BY MOUTH AT BEDTIME 04/12/19   [place], Velna HatchetKawanta F, MD    Family History Family History  Problem Relation Age of Onset  . Diabetes Brother   . Stroke Brother   . Cancer Brother        prostate  . Kidney disease Sister     Social History Social History   Tobacco Use  . Smoking status: Former Smoker    Packs/day: 1.00    Years: 4.00    Pack years: 4.00    Types: Cigarettes    Quit date: 02/09/1998    Years since quitting: 21.2  . Smokeless tobacco: Never Used  Substance  Use Topics  . Alcohol use: No    Alcohol/week: 0.0 standard drinks  . Drug use: No     Allergies   Patient has no known allergies.   Review of Systems Review of Systems  Constitutional: Negative for activity change, appetite change and fever.  HENT: Positive for ear pain. Negative for congestion, dental problem, ear discharge and hearing loss.   Respiratory: Negative for cough, chest tightness and shortness of breath.   Cardiovascular: Negative for chest pain.  Gastrointestinal: Negative for abdominal pain, nausea and vomiting.  Genitourinary: Negative for dysuria and hematuria.  Musculoskeletal: Negative for arthralgias and myalgias.  Skin: Negative for rash.  Neurological: Negative for dizziness, weakness and headaches.    all other systems are negative except as noted in the HPI  and PMH.    Physical Exam Updated Vital Signs BP (!) 155/60 (BP Location: Right Arm)   Pulse 83   Temp 98.7 F (37.1 C) (Oral)   Resp 17   SpO2 96%   Physical Exam Vitals signs and nursing note reviewed.  Constitutional:      General: He is not in acute distress.    Appearance: He is well-developed.  HENT:     Head: Normocephalic and atraumatic.     Left Ear: Tympanic membrane normal.     Ears:     Comments: Insect seen in right ear canal, TM not visible, no bleeding    Mouth/Throat:     Pharynx: No oropharyngeal exudate.  Eyes:     Conjunctiva/sclera: Conjunctivae normal.     Pupils: Pupils are equal, round, and reactive to light.  Neck:     Musculoskeletal: Normal range of motion and neck supple.     Comments: No meningismus. Cardiovascular:     Rate and Rhythm: Normal rate and regular rhythm.     Heart sounds: Normal heart sounds. No murmur.  Pulmonary:     Effort: Pulmonary effort is normal. No respiratory distress.     Breath sounds: Normal breath sounds.  Abdominal:     Palpations: Abdomen is soft.     Tenderness: There is no abdominal tenderness. There is no guarding or rebound.  Musculoskeletal: Normal range of motion.        General: No tenderness.  Skin:    General: Skin is warm.     Capillary Refill: Capillary refill takes less than 2 seconds.  Neurological:     General: No focal deficit present.     Mental Status: He is alert and oriented to person, place, and time. Mental status is at baseline.     Cranial Nerves: No cranial nerve deficit.     Motor: No abnormal muscle tone.     Coordination: Coordination normal.     Comments: No ataxia on finger to nose bilaterally. No pronator drift. 5/5 strength throughout. CN 2-12 intact.Equal grip strength. Sensation intact.   Psychiatric:        Behavior: Behavior normal.      ED Treatments / Results  Labs (all labs ordered are listed, but only abnormal results are displayed) Labs Reviewed - No data to  display  EKG None  Radiology No results found.  Procedures .Foreign Body Removal  Date/Time: 05/10/2019 3:08 AM Performed by: Ezequiel Essex, MD Authorized by: Ezequiel Essex, MD  Consent: Verbal consent obtained. Risks and benefits: risks, benefits and alternatives were discussed Consent given by: patient Patient understanding: patient states understanding of the procedure being performed Patient consent: the patient's understanding of the procedure matches  consent given Site marked: the operative site was marked Imaging studies: imaging studies not available Patient identity confirmed: verbally with patient and provided demographic data Time out: Immediately prior to procedure a "time out" was called to verify the correct patient, procedure, equipment, support staff and site/side marked as required. Body area: ear Location details: right ear  Sedation: Patient sedated: no  Patient restrained: no Patient cooperative: yes Localization method: visualized and ENT speculum Removal mechanism: irrigation and alligator forceps Complexity: simple 1 objects recovered. Objects recovered: partial bug Post-procedure assessment: residual foreign bodies remain Patient tolerance: patient tolerated the procedure well with no immediate complications   (including critical care time)  Medications Ordered in ED Medications  hydrogen peroxide 3 % external solution (has no administration in time range)     Initial Impression / Assessment and Plan / ED Course  I have reviewed the triage vital signs and the nursing notes.  Pertinent labs & imaging results that were available during my care of the patient were reviewed by me and considered in my medical decision making (see chart for details).       Foreign body to right ear.  No bleeding or drainage.  Foreign body was partially removed.  Unable to remove other portions of the bug.  Patient requested that further attempts to be  stopped.  Unable to remove the entire bug.  This is despite multiple episodes of irrigation and alligator forceps.  Patient will be given antibiotic drops and told to call ENT in the morning. He agrees to call ENT in the morning for an appointment.  He understands he is free to return at any time if he wishes further foreign body attempt to be undertaken. Return precautions discussed.   Final Clinical Impressions(s) / ED Diagnoses   Final diagnoses:  Foreign body of right ear, initial encounter    ED Discharge Orders    None       Jenilyn Magana, Jeannett SeniorStephen, MD 05/10/19 [phone]

## 2019-05-10 NOTE — ED Triage Notes (Signed)
Pt states he "smakced a bug on my face and a went into my ear." Pt has what appears to be a roach in his ear. Denies pain at this time.

## 2019-08-15 ENCOUNTER — Ambulatory Visit (INDEPENDENT_AMBULATORY_CARE_PROVIDER_SITE_OTHER): Payer: Managed Care, Other (non HMO) | Admitting: Family Medicine

## 2019-08-15 ENCOUNTER — Other Ambulatory Visit: Payer: Self-pay

## 2019-08-15 ENCOUNTER — Encounter: Payer: Self-pay | Admitting: Family Medicine

## 2019-08-15 VITALS — BP 122/68 | HR 65 | Temp 97.7°F | Resp 18 | Ht 68.0 in | Wt 258.2 lb

## 2019-08-15 DIAGNOSIS — Z0001 Encounter for general adult medical examination with abnormal findings: Secondary | ICD-10-CM

## 2019-08-15 DIAGNOSIS — E782 Mixed hyperlipidemia: Secondary | ICD-10-CM | POA: Diagnosis not present

## 2019-08-15 DIAGNOSIS — Z0000 Encounter for general adult medical examination without abnormal findings: Secondary | ICD-10-CM

## 2019-08-15 DIAGNOSIS — R21 Rash and other nonspecific skin eruption: Secondary | ICD-10-CM | POA: Diagnosis not present

## 2019-08-15 DIAGNOSIS — Z6839 Body mass index (BMI) 39.0-39.9, adult: Secondary | ICD-10-CM

## 2019-08-15 DIAGNOSIS — R300 Dysuria: Secondary | ICD-10-CM

## 2019-08-15 DIAGNOSIS — Z125 Encounter for screening for malignant neoplasm of prostate: Secondary | ICD-10-CM

## 2019-08-15 LAB — URINALYSIS, ROUTINE W REFLEX MICROSCOPIC
Bilirubin Urine: NEGATIVE
Glucose, UA: NEGATIVE
Hgb urine dipstick: NEGATIVE
Ketones, ur: NEGATIVE
Leukocytes,Ua: NEGATIVE
Nitrite: NEGATIVE
Protein, ur: NEGATIVE
Specific Gravity, Urine: 1.025 (ref 1.001–1.03)
pH: 5.5 (ref 5.0–8.0)

## 2019-08-15 LAB — WET PREP FOR TRICH, YEAST, CLUE

## 2019-08-15 NOTE — Assessment & Plan Note (Signed)
Check lipid panel.  With been trying to control with his diet however he has not lost any weight since her last visit expect that this may still be elevated.

## 2019-08-15 NOTE — Patient Instructions (Signed)
F/U 1 year for physical We will call with lab results  Keep working on healthy eating

## 2019-08-15 NOTE — Progress Notes (Signed)
Subjective:    Patient ID: CASSON CATENA, male    DOB: 10-Jul-1956, 63 y.o.   MRN: [id number]  Patient presents for Annual Exam  Pt here for CPE  Dr. Denman George in [facility], he had right cataract surgery 3 weeks ago, this stemmed from an eye injury at work  Still on 1 eye drop   Gout- he is on allopurinol 100mg  has not had any recent flares.  Hyperlipidemia-due for recheck on his cholesterol.  For the past month- He gets recurrent episodes of itching, swelling with redness of his foreskin, he has been using lotrimen cream and that helps.  He is also noted some cracks in the skin.  He is not sexually active   Some dysuria on and off.  He is still using flomax for BPH    Immunizations- TDAP UTD, Due for flu shot , declines shingles vaccine.  Colonoscopy UTD       Review Of Systems:  GEN- denies fatigue, fever, weight loss,weakness, recent illness HEENT- denies eye drainage, change in vision, nasal discharge, CVS- denies chest pain, palpitations RESP- denies SOB, cough, wheeze ABD- denies N/V, change in stools, abd pain GU- denies dysuria, hematuria, dribbling, incontinence MSK- denies joint pain, muscle aches, injury Neuro- denies headache, dizziness, syncope, seizure activity       Objective:    BP 122/68 (BP Location: Right Arm, Patient Position: Sitting, Cuff Size: Large)   Pulse 65   Temp 97.7 F (36.5 C) (Oral)   Resp 18   Ht 5\' 8"  (1.727 m)   Wt 258 lb 3.2 oz (117.1 kg)   SpO2 95%   BMI 39.26 kg/m  GEN- NAD, alert and oriented x3, obese HEENT- PERRL, EOMI, non injected sclera, pink conjunctiva, MMM, oropharynx clear, TM clear bilaterally nares clear Neck- Supple, no thyromegaly, no carotid bruit CVS- RRR, no murmur RESP-CTAB ABD-NABS,soft,NT,ND GU testes descended bilaterally penile shaft with hypopigmentation erythema with cracks around the corona mild tenderness to palpation.  Clear discharge from some of the lesions EXT- No edema Pulses- Radial, DP-  2+        Assessment & Plan:      Problem List Items Addressed This Visit      Unprioritized   Hyperlipidemia, mixed    Check lipid panel.  With been trying to control with his diet however he has not lost any weight since her last visit expect that this may still be elevated.      Relevant Orders   Lipid panel   Obesity    Other Visit Diagnoses    Routine general medical examination at a health care facility    -  Primary   CPE done.  HIV screening fasting labs to be done.  Declines shingles vaccine.   Relevant Orders   CBC with Differential/Platelet   Comprehensive metabolic panel   Lipid panel   HIV Antibody (routine testing w rflx)   Prostate cancer screening       Relevant Orders   PSA   Penile rash       He has hypopigmentation but also cracking in the skin possible yeast versus herpes he does have cold sore, or other bacterial superinfection balanitis.  He will continue the clotrimazole until we see the results of the cultures and lab work.   Relevant Orders   HSV(herpes simplex vrs) 1+2 ab-IgG   WET PREP FOR TRICH, YEAST, CLUE (Completed)   Herpes simplex virus culture   WOUND CULTURE   Dysuria  Check UA and culture in the setting of his rash.   Relevant Orders   Urinalysis, Routine w reflex microscopic   Urine Culture      Note: This dictation was prepared with Dragon dictation along with smaller phrase technology. Any transcriptional errors that result from this process are unintentional.

## 2019-08-16 LAB — CBC WITH DIFFERENTIAL/PLATELET
Absolute Monocytes: 800 cells/uL (ref 200–950)
Basophils Absolute: 64 cells/uL (ref 0–200)
Basophils Relative: 0.7 %
Eosinophils Absolute: 230 cells/uL (ref 15–500)
Eosinophils Relative: 2.5 %
HCT: 45.5 % (ref 38.5–50.0)
Hemoglobin: 14.7 g/dL (ref 13.2–17.1)
Lymphs Abs: 1702 cells/uL (ref 850–3900)
MCH: 25.3 pg — ABNORMAL LOW (ref 27.0–33.0)
MCHC: 32.3 g/dL (ref 32.0–36.0)
MCV: 78.4 fL — ABNORMAL LOW (ref 80.0–100.0)
MPV: 10.3 fL (ref 7.5–12.5)
Monocytes Relative: 8.7 %
Neutro Abs: 6403 cells/uL (ref 1500–7800)
Neutrophils Relative %: 69.6 %
Platelets: 323 10*3/uL (ref 140–400)
RBC: 5.8 10*6/uL (ref 4.20–5.80)
RDW: 17 % — ABNORMAL HIGH (ref 11.0–15.0)
Total Lymphocyte: 18.5 %
WBC: 9.2 10*3/uL (ref 3.8–10.8)

## 2019-08-16 LAB — HSV(HERPES SIMPLEX VRS) I + II AB-IGG
HAV 1 IGG,TYPE SPECIFIC AB: 41 index — ABNORMAL HIGH
HSV 2 IGG,TYPE SPECIFIC AB: <0.9 index

## 2019-08-16 LAB — COMPREHENSIVE METABOLIC PANEL
AG Ratio: 1.5 (calc) (ref 1.0–2.5)
ALT: 18 U/L (ref 9–46)
AST: 22 U/L (ref 10–35)
Albumin: 4.1 g/dL (ref 3.6–5.1)
Alkaline phosphatase (APISO): 71 U/L (ref 35–144)
BUN/Creatinine Ratio: 13 (calc) (ref 6–22)
BUN: 18 mg/dL (ref 7–25)
CO2: 22 mmol/L (ref 20–32)
Calcium: 9.3 mg/dL (ref 8.6–10.3)
Chloride: 107 mmol/L (ref 98–110)
Creat: 1.38 mg/dL — ABNORMAL HIGH (ref 0.70–1.25)
Globulin: 2.7 g/dL (calc) (ref 1.9–3.7)
Glucose, Bld: 85 mg/dL (ref 65–99)
Potassium: 4.9 mmol/L (ref 3.5–5.3)
Sodium: 139 mmol/L (ref 135–146)
Total Bilirubin: 0.4 mg/dL (ref 0.2–1.2)
Total Protein: 6.8 g/dL (ref 6.1–8.1)

## 2019-08-16 LAB — LIPID PANEL
Cholesterol: 165 mg/dL (ref <200)
HDL: 29 mg/dL — ABNORMAL LOW (ref >=40)
LDL Cholesterol (Calc): 105 mg/dL (calc) — ABNORMAL HIGH
Non-HDL Cholesterol (Calc): 136 mg/dL (calc) — ABNORMAL HIGH (ref <130)
Total CHOL/HDL Ratio: 5.7 (calc) — ABNORMAL HIGH (ref <5.0)
Triglycerides: 192 mg/dL — ABNORMAL HIGH (ref <150)

## 2019-08-16 LAB — HIV ANTIBODY (ROUTINE TESTING W REFLEX): HIV 1&2 Ab, 4th Generation: NONREACTIVE

## 2019-08-16 LAB — PSA: PSA: 0.3 ng/mL (ref <=4.0)

## 2019-08-17 LAB — HERPES SIMPLEX VIRUS CULTURE
MICRO NUMBER:: 1059628
SPECIMEN QUALITY:: ADEQUATE

## 2019-08-17 LAB — URINE CULTURE
MICRO NUMBER:: 1059172
SPECIMEN QUALITY:: ADEQUATE

## 2019-08-18 ENCOUNTER — Other Ambulatory Visit: Payer: Self-pay

## 2019-08-18 ENCOUNTER — Other Ambulatory Visit: Payer: Self-pay | Admitting: *Deleted

## 2019-08-18 MED ORDER — HYDROCORTISONE 1 % EX OINT: 1.0000 "application " | TOPICAL_OINTMENT | Freq: Two times a day (BID) | CUTANEOUS | 0 refills | Status: DC

## 2019-08-18 MED ORDER — OMEGA 3 1000 MG PO CAPS: 1.0000 | ORAL_CAPSULE | Freq: Two times a day (BID) | ORAL | 11 refills | Status: AC

## 2019-08-18 MED ORDER — CLOTRIMAZOLE 1 % EX CREA: 1.0000 "application " | TOPICAL_CREAM | Freq: Two times a day (BID) | CUTANEOUS | 0 refills | Status: DC

## 2019-08-18 MED ORDER — FLUCONAZOLE 100 MG PO TABS: 100.0000 mg | ORAL_TABLET | Freq: Every day | ORAL | 0 refills | Status: AC

## 2019-08-18 NOTE — Telephone Encounter (Signed)
Pt called back for results. Verbalizes understanding. I stated the Rx's has been sent to his pharmacy.

## 2019-08-19 LAB — WOUND CULTURE
MICRO NUMBER:: 1059174
RESULT:: NORMAL
SPECIMEN QUALITY:: ADEQUATE

## 2019-09-21 ENCOUNTER — Other Ambulatory Visit: Payer: Self-pay | Admitting: *Deleted

## 2019-09-21 MED ORDER — CLOTRIMAZOLE 1 % EX CREA: 1.0000 "application " | TOPICAL_CREAM | Freq: Two times a day (BID) | CUTANEOUS | 0 refills | Status: DC

## 2019-10-05 ENCOUNTER — Other Ambulatory Visit: Payer: Self-pay | Admitting: Family Medicine

## 2019-12-10 ENCOUNTER — Ambulatory Visit: Payer: Managed Care, Other (non HMO) | Attending: Internal Medicine

## 2019-12-10 DIAGNOSIS — Z23 Encounter for immunization: Secondary | ICD-10-CM

## 2019-12-10 NOTE — Progress Notes (Signed)
   Covid-19 Vaccination Clinic  Name:  [name]    MRN: [id number] DOB: [birth year]  12/10/2019  Mr. Bandel was observed post Covid-19 immunization for 15 minutes without incidence. He was provided with Vaccine Information Sheet and instruction to access the V-Safe system.   [name] was instructed to call 911 with any severe reactions post vaccine: Marland Kitchen Difficulty breathing  . Swelling of your face and throat  . A fast heartbeat  . A bad rash all over your body  . Dizziness and weakness    Immunizations Administered    Name Date Dose VIS Date Route   Moderna COVID-19 Vaccine 12/10/2019  1:53 PM 0.5 mL 09/12/2019 Intramuscular   Manufacturer: Moderna   Lot: [id number]   NDC: 06349-494-47

## 2020-01-11 ENCOUNTER — Other Ambulatory Visit: Payer: Self-pay | Admitting: Family Medicine

## 2020-01-13 ENCOUNTER — Ambulatory Visit: Payer: Managed Care, Other (non HMO)

## 2020-01-20 ENCOUNTER — Ambulatory Visit: Payer: Managed Care, Other (non HMO)

## 2020-01-20 ENCOUNTER — Ambulatory Visit: Payer: Managed Care, Other (non HMO) | Attending: Internal Medicine

## 2020-01-20 DIAGNOSIS — Z23 Encounter for immunization: Secondary | ICD-10-CM

## 2020-01-20 NOTE — Progress Notes (Signed)
   Covid-19 Vaccination Clinic  Name:  Larry Fleming    MRN: [id number] DOB: 03-18-1956  01/20/2020  Larry Fleming was observed post Covid-19 immunization for 15 minutes without incident. He was provided with Vaccine Information Sheet and instruction to access the V-Safe system.   Larry Fleming was instructed to call 911 with any severe reactions post vaccine: Marland Kitchen Difficulty breathing  . Swelling of face and throat  . A fast heartbeat  . A bad rash all over body  . Dizziness and weakness   Immunizations Administered    Name Date Dose VIS Date Route   Moderna COVID-19 Vaccine 01/20/2020  9:18 AM 0.5 mL 09/12/2019 Intramuscular   Manufacturer: Moderna   Lot: [id number]   NDC: 98338-250-53

## 2020-01-30 ENCOUNTER — Other Ambulatory Visit: Payer: Self-pay

## 2020-01-30 ENCOUNTER — Ambulatory Visit: Payer: Managed Care, Other (non HMO) | Admitting: Family Medicine

## 2020-01-30 ENCOUNTER — Encounter: Payer: Self-pay | Admitting: Family Medicine

## 2020-01-30 VITALS — BP 158/72 | HR 77 | Temp 98.1°F | Resp 15 | Ht 68.0 in | Wt 257.1 lb

## 2020-01-30 DIAGNOSIS — E781 Pure hyperglyceridemia: Secondary | ICD-10-CM

## 2020-01-30 DIAGNOSIS — B3742 Candidal balanitis: Secondary | ICD-10-CM | POA: Diagnosis not present

## 2020-01-30 DIAGNOSIS — M1A9XX1 Chronic gout, unspecified, with tophus (tophi): Secondary | ICD-10-CM | POA: Diagnosis not present

## 2020-01-30 DIAGNOSIS — R030 Elevated blood-pressure reading, without diagnosis of hypertension: Secondary | ICD-10-CM

## 2020-01-30 DIAGNOSIS — E669 Obesity, unspecified: Secondary | ICD-10-CM

## 2020-01-30 MED ORDER — CLOTRIMAZOLE 1 % EX CREA: 1.0000 "application " | TOPICAL_CREAM | Freq: Two times a day (BID) | CUTANEOUS | 2 refills | Status: DC

## 2020-01-30 MED ORDER — FLUCONAZOLE 100 MG PO TABS: 100.0000 mg | ORAL_TABLET | Freq: Every day | ORAL | 0 refills | Status: DC

## 2020-01-30 MED ORDER — HYDROCORTISONE 2.5 % EX CREA: TOPICAL_CREAM | Freq: Two times a day (BID) | CUTANEOUS | 2 refills | Status: DC

## 2020-01-30 NOTE — Assessment & Plan Note (Signed)
Continue allopurinol.  We will recheck his renal function when he is fasting next week.  We will also obtain lipid panel.  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  In the meantime we will continue with clotrimazole and hydrocortisone which does help with the flares.  Have also given him Diflucan 100 mg once a day for a week.  Not diabetic but we will recheck this at his visit next week

## 2020-01-30 NOTE — Patient Instructions (Addendum)
Return for fasting labs next week  Referral to dermatologist  Check Blood pressure at home < 140/90  Call if staying above this  F/U 3 months

## 2020-01-30 NOTE — Progress Notes (Signed)
   Subjective:    Patient ID: Larry Fleming, male    DOB: 10-23-1955, 64 y.o.   MRN: [id number]  Patient presents for Hyperlipidemia and Rash (Has rash to foreskin )   Pt here to f/u chronic     He has had recurrent balanitis- he has used clotrimazole and cortisone which works but he continues to get flares if he stops.  He was given Diflucan orally in November that worked the best.  He gets pain with urination if there is a crack in the skin     Hyperlipidemia due for repeat fasting labs he is taking fish oil twice a day   Taking flomax for BPH and viagra    Gout- taking allopurinol       Due for repeat renl function he had mild renal insufficiency at last visit.       Review Of Systems:  GEN- denies fatigue, fever, weight loss,weakness, recent illness HEENT- denies eye drainage, change in vision, nasal discharge, CVS- denies chest pain, palpitations RESP- denies SOB, cough, wheeze ABD- denies N/V, change in stools, abd pain GU- denies dysuria, hematuria, dribbling, incontinence MSK- denies joint pain, muscle aches, injury Neuro- denies headache, dizziness, syncope, seizure activity       Objective:    BP (!) 158/72   Pulse 77   Temp 98.1 F (36.7 C) (Temporal)   Resp 15   Ht 5\' 8"  (1.727 m)   Wt 257 lb 2 oz (116.6 kg)   SpO2 94%   BMI 39.10 kg/m  GEN- NAD, alert and oriented x3 ,  HEENT- PERRL, EOMI, non injected sclera, pink conjunctiva, MMM, oropharynx clear Neck- Supple, no thyromegaly CVS- RRR, no murmur RESP-CTAB ABD-NABS,soft,NT,ND EXT- No edema Pulses- Radial, DP- 2+        Assessment & Plan:      Problem List Items Addressed This Visit      Unprioritized   Class 3 obesity    Discussed reducing sugars, soda, cookes/junk food Increasing water         Gouty tophi of joint    Continue allopurinol.  We will recheck his renal function when he is fasting next week.  We will also obtain lipid panel.  Recurrent candidiasis balanitis we will  refer him to dermatology.  In the meantime we will continue with clotrimazole and hydrocortisone which does help with the flares.  Have also given him Diflucan 100 mg once a day for a week.  Not diabetic but we will recheck this at his visit next week       Other Visit Diagnoses    Elevated blood pressure reading    -  Primary   Relevant Orders   Comprehensive metabolic panel   CBC with Differential/Platelet   Lipid panel   Hemoglobin A1c   Candidal balanitis       Relevant Medications   fluconazole (DIFLUCAN) 100 MG tablet   clotrimazole (LOTRIMIN) 1 % cream   Hypertriglyceridemia       Relevant Orders   Comprehensive metabolic panel   Lipid panel      Note: This dictation was prepared with Dragon dictation along with smaller phrase technology. Any transcriptional errors that result from this process are unintentional.

## 2020-01-30 NOTE — Assessment & Plan Note (Signed)
Discussed reducing sugars, soda, cookes/junk food Increasing water

## 2020-02-06 ENCOUNTER — Encounter: Payer: Self-pay | Admitting: Family Medicine

## 2020-02-07 ENCOUNTER — Other Ambulatory Visit: Payer: Managed Care, Other (non HMO)

## 2020-02-07 ENCOUNTER — Other Ambulatory Visit: Payer: Self-pay

## 2020-02-07 DIAGNOSIS — R030 Elevated blood-pressure reading, without diagnosis of hypertension: Secondary | ICD-10-CM

## 2020-02-07 DIAGNOSIS — E781 Pure hyperglyceridemia: Secondary | ICD-10-CM

## 2020-02-08 LAB — COMPREHENSIVE METABOLIC PANEL
AG Ratio: 1.5 (calc) (ref 1.0–2.5)
ALT: 20 U/L (ref 9–46)
AST: 18 U/L (ref 10–35)
Albumin: 4 g/dL (ref 3.6–5.1)
Alkaline phosphatase (APISO): 74 U/L (ref 35–144)
BUN: 18 mg/dL (ref 7–25)
CO2: 19 mmol/L — ABNORMAL LOW (ref 20–32)
Calcium: 9.1 mg/dL (ref 8.6–10.3)
Chloride: 106 mmol/L (ref 98–110)
Creat: 1.2 mg/dL (ref 0.70–1.25)
Globulin: 2.6 g/dL (calc) (ref 1.9–3.7)
Glucose, Bld: 101 mg/dL — ABNORMAL HIGH (ref 65–99)
Potassium: 4.8 mmol/L (ref 3.5–5.3)
Sodium: 136 mmol/L (ref 135–146)
Total Bilirubin: 0.4 mg/dL (ref 0.2–1.2)
Total Protein: 6.6 g/dL (ref 6.1–8.1)

## 2020-02-08 LAB — LIPID PANEL
Cholesterol: 185 mg/dL (ref <200)
HDL: 27 mg/dL — ABNORMAL LOW (ref >=40)
LDL Cholesterol (Calc): 124 mg/dL (calc) — ABNORMAL HIGH
Non-HDL Cholesterol (Calc): 158 mg/dL (calc) — ABNORMAL HIGH (ref <130)
Total CHOL/HDL Ratio: 6.9 (calc) — ABNORMAL HIGH (ref <5.0)
Triglycerides: 227 mg/dL — ABNORMAL HIGH (ref <150)

## 2020-02-08 LAB — CBC WITH DIFFERENTIAL/PLATELET
Absolute Monocytes: 635 cells/uL (ref 200–950)
Basophils Absolute: 62 cells/uL (ref 0–200)
Basophils Relative: 0.9 %
Eosinophils Absolute: 400 cells/uL (ref 15–500)
Eosinophils Relative: 5.8 %
HCT: 47.9 % (ref 38.5–50.0)
Hemoglobin: 15.1 g/dL (ref 13.2–17.1)
Lymphs Abs: 1594 cells/uL (ref 850–3900)
MCH: 25.2 pg — ABNORMAL LOW (ref 27.0–33.0)
MCHC: 31.5 g/dL — ABNORMAL LOW (ref 32.0–36.0)
MCV: 79.8 fL — ABNORMAL LOW (ref 80.0–100.0)
MPV: 10 fL (ref 7.5–12.5)
Monocytes Relative: 9.2 %
Neutro Abs: 4209 cells/uL (ref 1500–7800)
Neutrophils Relative %: 61 %
Platelets: 309 10*3/uL (ref 140–400)
RBC: 6 10*6/uL — ABNORMAL HIGH (ref 4.20–5.80)
RDW: 17.6 % — ABNORMAL HIGH (ref 11.0–15.0)
Total Lymphocyte: 23.1 %
WBC: 6.9 10*3/uL (ref 3.8–10.8)

## 2020-02-08 LAB — HEMOGLOBIN A1C
Hgb A1c MFr Bld: 4.7 % of total Hgb (ref <5.7)
Mean Plasma Glucose: 88 (calc)
eAG (mmol/L): 4.9 (calc)

## 2020-02-09 ENCOUNTER — Other Ambulatory Visit: Payer: Self-pay | Admitting: *Deleted

## 2020-02-09 MED ORDER — ATORVASTATIN CALCIUM 20 MG PO TABS: 20.0000 mg | ORAL_TABLET | Freq: Every day | ORAL | 3 refills | Status: DC

## 2020-03-13 ENCOUNTER — Other Ambulatory Visit: Payer: Self-pay | Admitting: Family Medicine

## 2020-04-16 ENCOUNTER — Other Ambulatory Visit: Payer: Self-pay | Admitting: Family Medicine

## 2020-05-03 ENCOUNTER — Ambulatory Visit: Payer: Managed Care, Other (non HMO) | Admitting: Family Medicine

## 2020-05-07 ENCOUNTER — Other Ambulatory Visit: Payer: Self-pay

## 2020-05-07 ENCOUNTER — Ambulatory Visit (INDEPENDENT_AMBULATORY_CARE_PROVIDER_SITE_OTHER): Payer: Managed Care, Other (non HMO) | Admitting: Family Medicine

## 2020-05-07 ENCOUNTER — Encounter: Payer: Self-pay | Admitting: Family Medicine

## 2020-05-07 VITALS — BP 132/80 | HR 66 | Temp 98.2°F | Resp 16 | Ht 68.0 in | Wt 247.0 lb

## 2020-05-07 DIAGNOSIS — M1A9XX1 Chronic gout, unspecified, with tophus (tophi): Secondary | ICD-10-CM

## 2020-05-07 DIAGNOSIS — E782 Mixed hyperlipidemia: Secondary | ICD-10-CM

## 2020-05-07 DIAGNOSIS — E669 Obesity, unspecified: Secondary | ICD-10-CM

## 2020-05-07 DIAGNOSIS — N400 Enlarged prostate without lower urinary tract symptoms: Secondary | ICD-10-CM

## 2020-05-07 NOTE — Assessment & Plan Note (Signed)
Doing well on flomax no changes

## 2020-05-07 NOTE — Patient Instructions (Signed)
F/U 4-5 months For Physical

## 2020-05-07 NOTE — Progress Notes (Signed)
   Subjective:    Patient ID: Larry Fleming, male    DOB: [birth year], 64 y.o.   MRN: [id number]  Patient presents for Follow-up (is fasting) and R Elbow Pain (woke up Snday with pain/ swollen/ warm to touch)   Diagnosed with chronic balanitits using [place] as needed    Right elbow pain- this past weekend he had swelling, erythema and warmth to elbow, he has been taking alliopurinol he used black cherry tart juice and pain and swelling has improved     BPH- taking flomax without any difficulty     Bp elevated at lst visit improved today    Weight down 10lbs in the past 3 months  decreased portion sizes, cut ack on junk food  He has cut down on Soda, drinking water and occ juice , cut down on bread   Hyperlipidemia taking fish oil - TG elevated at  227    Review Of Systems:  GEN- denies fatigue, fever, weight loss,weakness, recent illness HEENT- denies eye drainage, change in vision, nasal discharge, CVS- denies chest pain, palpitations RESP- denies SOB, cough, wheeze ABD- denies N/V, change in stools, abd pain GU- denies dysuria, hematuria, dribbling, incontinence MSK- + joint pain, muscle aches, injury Neuro- denies headache, dizziness, syncope, seizure activity       Objective:    BP (!) 132/80   Pulse 66   Temp 98.2 F (36.8 C) (Temporal)   Resp 16   Ht '5\' 8"'$  (1.727 m)   Wt (!) 247 lb (112 kg)   SpO2 94%   BMI 37.56 kg/m  GEN- NAD, alert and oriented x3 HEENT- PERRL, EOMI, non injected sclera, pink conjunctiva, MMM, oropharynx clear CVS- RRR, no murmur RESP-CTAB MSK- Right elbow FROM, NT, no erythema, mild swelling/TOPHI, no warmth  EXT- No edema Pulses- Radial  2+        Assessment & Plan:      Problem List Items Addressed This Visit      Unprioritized   Benign prostatic hyperplasia    Doing well on flomax no changes       Class 2 obesity    Intentional 10lb weight loss, trying to improve overall health and cholesterol Recheck lipids,CMET today         Gouty tophi of joint - Primary    Check uric acid with recent flare, no dietary changes recently  Improved with cherry tart  Continue allopurinol will titrate based on uric acid level result      Relevant Orders   Comprehensive metabolic panel   CBC with Differential/Platelet   Uric Acid   Hyperlipidemia, mixed   Relevant Orders   CBC with Differential/Platelet   Lipid panel      Note: This dictation was prepared with Dragon dictation along with smaller phrase technology. Any transcriptional errors that result from this process are unintentional.

## 2020-05-07 NOTE — Assessment & Plan Note (Signed)
Check uric acid with recent flare, no dietary changes recently  Improved with cherry tart  Continue allopurinol will titrate based on uric acid level result

## 2020-05-07 NOTE — Assessment & Plan Note (Signed)
Intentional 10lb weight loss, trying to improve overall health and cholesterol Recheck lipids,CMET today

## 2020-05-08 LAB — COMPREHENSIVE METABOLIC PANEL
AG Ratio: 1.3 (calc) (ref 1.0–2.5)
ALT: 14 U/L (ref 9–46)
AST: 17 U/L (ref 10–35)
Albumin: 3.9 g/dL (ref 3.6–5.1)
Alkaline phosphatase (APISO): 72 U/L (ref 35–144)
BUN/Creatinine Ratio: 12 (calc) (ref 6–22)
BUN: 17 mg/dL (ref 7–25)
CO2: 20 mmol/L (ref 20–32)
Calcium: 9.1 mg/dL (ref 8.6–10.3)
Chloride: 106 mmol/L (ref 98–110)
Creat: 1.38 mg/dL — ABNORMAL HIGH (ref 0.70–1.25)
Globulin: 3 g/dL (calc) (ref 1.9–3.7)
Glucose, Bld: 101 mg/dL — ABNORMAL HIGH (ref 65–99)
Potassium: 4.8 mmol/L (ref 3.5–5.3)
Sodium: 136 mmol/L (ref 135–146)
Total Bilirubin: 0.6 mg/dL (ref 0.2–1.2)
Total Protein: 6.9 g/dL (ref 6.1–8.1)

## 2020-05-08 LAB — CBC WITH DIFFERENTIAL/PLATELET
Absolute Monocytes: 643 cells/uL (ref 200–950)
Basophils Absolute: 40 cells/uL (ref 0–200)
Basophils Relative: 0.6 %
Eosinophils Absolute: 241 cells/uL (ref 15–500)
Eosinophils Relative: 3.6 %
HCT: 47.6 % (ref 38.5–50.0)
Hemoglobin: 15.5 g/dL (ref 13.2–17.1)
Lymphs Abs: 1481 cells/uL (ref 850–3900)
MCH: 25.7 pg — ABNORMAL LOW (ref 27.0–33.0)
MCHC: 32.6 g/dL (ref 32.0–36.0)
MCV: 78.8 fL — ABNORMAL LOW (ref 80.0–100.0)
MPV: 10.6 fL (ref 7.5–12.5)
Monocytes Relative: 9.6 %
Neutro Abs: 4295 cells/uL (ref 1500–7800)
Neutrophils Relative %: 64.1 %
Platelets: 338 10*3/uL (ref 140–400)
RBC: 6.04 10*6/uL — ABNORMAL HIGH (ref 4.20–5.80)
RDW: 18.1 % — ABNORMAL HIGH (ref 11.0–15.0)
Total Lymphocyte: 22.1 %
WBC: 6.7 10*3/uL (ref 3.8–10.8)

## 2020-05-08 LAB — LIPID PANEL
Cholesterol: 183 mg/dL (ref <200)
HDL: 24 mg/dL — ABNORMAL LOW (ref >=40)
LDL Cholesterol (Calc): 123 mg/dL (calc) — ABNORMAL HIGH
Non-HDL Cholesterol (Calc): 159 mg/dL (calc) — ABNORMAL HIGH (ref <130)
Total CHOL/HDL Ratio: 7.6 (calc) — ABNORMAL HIGH (ref <5.0)
Triglycerides: 235 mg/dL — ABNORMAL HIGH (ref <150)

## 2020-05-08 LAB — URIC ACID: Uric Acid, Serum: 6.8 mg/dL (ref 4.0–8.0)

## 2020-05-10 ENCOUNTER — Other Ambulatory Visit: Payer: Self-pay | Admitting: *Deleted

## 2020-05-10 DIAGNOSIS — E785 Hyperlipidemia, unspecified: Secondary | ICD-10-CM

## 2020-05-10 MED ORDER — ATORVASTATIN CALCIUM 10 MG PO TABS: 10.0000 mg | ORAL_TABLET | Freq: Every day | ORAL | 3 refills | Status: DC

## 2020-06-21 ENCOUNTER — Other Ambulatory Visit: Payer: Self-pay

## 2020-06-21 ENCOUNTER — Ambulatory Visit: Payer: Managed Care, Other (non HMO) | Admitting: Family Medicine

## 2020-06-21 VITALS — BP 124/56 | HR 100 | Temp 100.9°F | Resp 18 | Wt 245.4 lb

## 2020-06-21 DIAGNOSIS — M79605 Pain in left leg: Secondary | ICD-10-CM | POA: Diagnosis not present

## 2020-06-21 DIAGNOSIS — M1A9XX1 Chronic gout, unspecified, with tophus (tophi): Secondary | ICD-10-CM

## 2020-06-21 DIAGNOSIS — R509 Fever, unspecified: Secondary | ICD-10-CM

## 2020-06-21 MED ORDER — PREDNISONE 10 MG PO TABS: ORAL_TABLET | ORAL | 0 refills | Status: DC

## 2020-06-21 MED ORDER — DOXYCYCLINE HYCLATE 100 MG PO TABS: 100.0000 mg | ORAL_TABLET | Freq: Two times a day (BID) | ORAL | 0 refills | Status: DC

## 2020-06-21 NOTE — Patient Instructions (Signed)
Take the antibiotics Prednisone if the pain worsens We will call with COVID results

## 2020-06-21 NOTE — Progress Notes (Signed)
   Subjective:    Patient ID: Larry Fleming, male    DOB: 1956-05-21, 64 y.o.   MRN: [id number]  Patient presents for Leg Pain (L leg, started 09/06, no meds)  Pt here with left leg pain for the past week Has pain behind the left  Knee He would have pain when moving around, going up stairs This is the same side he has severe gout on and tophi on the knee, he had a little swelling to the knee but not severe. No pain in calf, no swelling in lower leg He decided to stop the cholesterol med- lipitor Monday and  Pain did improve through the week Also took OTC pain meds  During vitals he was noted to have fever, temperature was checked orally to confirm  He states he feels fine, no URI symptoms and leg pain is much better today and no swelling No know sick contacts,  COVID vaccine UTD      Review Of Systems:  GEN- denies fatigue, fever, weight loss,weakness, recent illness HEENT- denies eye drainage, change in vision, nasal discharge, CVS- denies chest pain, palpitations RESP- denies SOB, cough, wheeze ABD- denies N/V, change in stools, abd pain GU- denies dysuria, hematuria, dribbling, incontinence MSK- + joint pain,denies   muscle aches, injury Neuro- denies headache, dizziness, syncope, seizure activity       Objective:    BP (!) 124/56 (BP Location: Right Arm, Patient Position: Sitting, Cuff Size: Large)   Pulse 100   Temp (!) 100.9 F (38.3 C) (Temporal)   Resp 18   Wt 245 lb 6.4 oz (111.3 kg)   SpO2 95%   BMI 37.31 kg/m  GEN- NAD, alert and oriented x3  , repeat oral temp 101.68F HEENT- PERRL, EOMI, non injected sclera, pink conjunctiva, MMM, oropharynx clear Neck- Supple, no thyromegaly CVS- RRR, no murmur RESP-CTAB ABD-NABS,soft,NT,ND MSK- Left knee- fair ROM, no erythema, mild swelling/TOPHI, no warmth , leg NT, neg homans EXT- No edema lower ext/ankle Pulses- Radial2+        Assessment & Plan:      Problem List Items Addressed This Visit       Unprioritized   Gouty tophi of joint    Complaint of leg pain, however found to have fever during examination with no other symptoms Leg pain, swelling improved ? Due to stopping statin which I doubt as I would expect both legs to have had had myalgias  I think pain was due to gout flare and that has slowly resolved, I did query septic arthritis in setting of random fever, but exam improved, joint does not appear infected and no significant effusion   In the setting of pandemic Will swab for covid in case of breakthrough case Will give prednisone taper in case leg pain flares again to treat gout Will empirically start doxycycline to cover bacterial infection/early septic arthritis symptoms       Relevant Medications   predniSONE (DELTASONE) 10 MG tablet    Other Visit Diagnoses    Fever, unknown origin    -  Primary   Relevant Orders   SARS-COV-2 RNA,(COVID-19) QUAL NAAT (Completed)   Left leg pain          Note: This dictation was prepared with Dragon dictation along with smaller phrase technology. Any transcriptional errors that result from this process are unintentional.

## 2020-06-22 LAB — SARS-COV-2 RNA,(COVID-19) QUALITATIVE NAAT: SARS CoV2 RNA: DETECTED — CR

## 2020-06-23 ENCOUNTER — Encounter: Payer: Self-pay | Admitting: Family Medicine

## 2020-06-23 ENCOUNTER — Other Ambulatory Visit: Payer: Self-pay | Admitting: Adult Health

## 2020-06-23 NOTE — Assessment & Plan Note (Signed)
Complaint of leg pain, however found to have fever during examination with no other symptoms Leg pain, swelling improved ? Due to stopping statin which I doubt as I would expect both legs to have had had myalgias  I think pain was due to gout flare and that has slowly resolved, I did query septic arthritis in setting of random fever, but exam improved, joint does not appear infected and no significant effusion   In the setting of pandemic Will swab for covid in case of breakthrough case Will give prednisone taper in case leg pain flares again to treat gout Will empirically start doxycycline to cover bacterial infection/early septic arthritis symptoms

## 2020-06-23 NOTE — Progress Notes (Signed)
I connected by phone with Larry Fleming on 06/23/2020 at 1:51 PM to discuss the potential use of a new treatment for mild to moderate COVID-19 viral infection in non-hospitalized patients.  This patient is a 64 y.o. male that meets the FDA criteria for Emergency Use Authorization of COVID monoclonal antibody casirivimab/imdevimab.  Has a (+) direct SARS-CoV-2 viral test result  Has mild or moderate COVID-19   Is NOT hospitalized due to COVID-19  Is within 10 days of symptom onset  Has at least one of the high risk factor(s) for progression to severe COVID-19 and/or hospitalization as defined in EUA.  Specific high risk criteria : BMI > 25   I have spoken and communicated the following to the patient or parent/caregiver regarding COVID monoclonal antibody treatment:  1. FDA has authorized the emergency use for the treatment of mild to moderate COVID-19 in adults and pediatric patients with positive results of direct SARS-CoV-2 viral testing who are 14 years of age and older weighing at least 40 kg, and who are at high risk for progressing to severe COVID-19 and/or hospitalization.  2. The significant known and potential risks and benefits of COVID monoclonal antibody, and the extent to which such potential risks and benefits are unknown.  3. Information on available alternative treatments and the risks and benefits of those alternatives, including clinical trials.  4. Patients treated with COVID monoclonal antibody should continue to self-isolate and use infection control measures (e.g., wear mask, isolate, social distance, avoid sharing personal items, clean and disinfect "high touch" surfaces, and frequent handwashing) according to CDC guidelines.   5. The patient or parent/caregiver has the option to accept or refuse COVID monoclonal antibody treatment.  After reviewing this information with the patient, The patient agreed to proceed with receiving casirivimab\imdevimab infusion and  will be provided a copy of the Fact sheet prior to receiving the infusion.  Set up for 06/24/20 at 1600  [name] 06/23/2020 1:51 PM

## 2020-06-24 ENCOUNTER — Ambulatory Visit (HOSPITAL_COMMUNITY)
Admission: RE | Admit: 2020-06-24 | Discharge: 2020-06-24 | Disposition: A | Discharge status: Home / Self Care | Payer: Managed Care, Other (non HMO) | Source: Ambulatory Visit | Attending: Pulmonary Disease | Admitting: Pulmonary Disease

## 2020-06-24 DIAGNOSIS — U071 COVID-19: Secondary | ICD-10-CM | POA: Diagnosis present

## 2020-06-24 MED ORDER — SODIUM CHLORIDE 0.9 % IV SOLN
1200.0000 mg | Freq: Once | INTRAVENOUS | Status: AC
  Administered 2020-06-24: 1200 mg via INTRAVENOUS
  Filled 2020-06-24: qty 10

## 2020-06-24 MED ORDER — DIPHENHYDRAMINE HCL 50 MG/ML IJ SOLN: 50.0000 mg | Freq: Once | INTRAMUSCULAR | Status: DC | PRN

## 2020-06-24 MED ORDER — ALBUTEROL SULFATE HFA 108 (90 BASE) MCG/ACT IN AERS: 2.0000 | INHALATION_SPRAY | Freq: Once | RESPIRATORY_TRACT | Status: DC | PRN

## 2020-06-24 MED ORDER — EPINEPHRINE 0.3 MG/0.3ML IJ SOAJ: 0.3000 mg | Freq: Once | INTRAMUSCULAR | Status: DC | PRN

## 2020-06-24 MED ORDER — METHYLPREDNISOLONE SODIUM SUCC 125 MG IJ SOLR: 125.0000 mg | Freq: Once | INTRAMUSCULAR | Status: DC | PRN

## 2020-06-24 MED ORDER — FAMOTIDINE IN NACL 20-0.9 MG/50ML-% IV SOLN: 20.0000 mg | Freq: Once | INTRAVENOUS | Status: DC | PRN

## 2020-06-24 MED ORDER — SODIUM CHLORIDE 0.9 % IV SOLN: INTRAVENOUS | Status: DC | PRN

## 2020-06-24 NOTE — Progress Notes (Signed)
  Diagnosis: COVID-19  Physician:Dr Wright  Procedure: [facility] Med: casirivimab\imdevimab infusion - Provided patient with casirivimab\imdevimab fact sheet for patients, parents and caregivers prior to infusion.  Complications: No immediate complications noted.  Discharge: Discharged home   Larry Fleming 06/24/2020  

## 2020-06-24 NOTE — Discharge Instructions (Signed)

## 2020-06-27 ENCOUNTER — Telehealth: Payer: Self-pay | Admitting: Family Medicine

## 2020-06-27 NOTE — Telephone Encounter (Signed)
CB# [phone] Was positive for covid on 06-21-20 by Dr.. Would he need another covid test if so when should he schedule so he can return to work

## 2020-06-27 NOTE — Telephone Encounter (Signed)
Please advise 

## 2020-06-28 NOTE — Telephone Encounter (Signed)
Call placed to patient and patient made aware.   He will pick up letter after 2pm.

## 2020-06-28 NOTE — Telephone Encounter (Signed)
He does not need another covid test I had written a note in the chart, return date 9/20 if symptoms improved

## 2020-07-13 ENCOUNTER — Other Ambulatory Visit: Payer: Self-pay | Admitting: Family Medicine

## 2020-08-15 ENCOUNTER — Other Ambulatory Visit: Payer: Self-pay | Admitting: Family Medicine

## 2020-09-02 ENCOUNTER — Ambulatory Visit (INDEPENDENT_AMBULATORY_CARE_PROVIDER_SITE_OTHER): Payer: Managed Care, Other (non HMO) | Admitting: Family Medicine

## 2020-09-02 ENCOUNTER — Encounter: Payer: Self-pay | Admitting: Family Medicine

## 2020-09-02 ENCOUNTER — Other Ambulatory Visit: Payer: Self-pay

## 2020-09-02 VITALS — BP 144/72 | HR 60 | Temp 98.6°F | Resp 16 | Ht 68.0 in | Wt 245.0 lb

## 2020-09-02 DIAGNOSIS — N400 Enlarged prostate without lower urinary tract symptoms: Secondary | ICD-10-CM

## 2020-09-02 DIAGNOSIS — M1A9XX1 Chronic gout, unspecified, with tophus (tophi): Secondary | ICD-10-CM | POA: Diagnosis not present

## 2020-09-02 DIAGNOSIS — E669 Obesity, unspecified: Secondary | ICD-10-CM | POA: Diagnosis not present

## 2020-09-02 DIAGNOSIS — E782 Mixed hyperlipidemia: Secondary | ICD-10-CM

## 2020-09-02 DIAGNOSIS — Z23 Encounter for immunization: Secondary | ICD-10-CM | POA: Diagnosis not present

## 2020-09-02 DIAGNOSIS — Z0000 Encounter for general adult medical examination without abnormal findings: Secondary | ICD-10-CM | POA: Diagnosis not present

## 2020-09-02 DIAGNOSIS — Z125 Encounter for screening for malignant neoplasm of prostate: Secondary | ICD-10-CM

## 2020-09-02 DIAGNOSIS — R030 Elevated blood-pressure reading, without diagnosis of hypertension: Secondary | ICD-10-CM

## 2020-09-02 MED ORDER — BOOSTRIX 5-2.5-18.5 LF-MCG/0.5 IM SUSP: 0.5000 mL | Freq: Once | INTRAMUSCULAR | 0 refills | Status: AC

## 2020-09-02 NOTE — Assessment & Plan Note (Signed)
Discussed dietary changes Reduce fried food, baked goods

## 2020-09-02 NOTE — Assessment & Plan Note (Signed)
No recent flares, continue allopurinol  Elevated BP readings, pt can check weekly, goal  < 140/90

## 2020-09-02 NOTE — Patient Instructions (Signed)
Get your tetanus shot from the pharmacy  Pneumonia vaccine given today  We will call with lab results Check your blood pressure at  Home goal is < 140/90 Reduce salt intake, fast food, fried foods, baked goods, this helps blood pressure, cholesterol and weight F/U  4 months

## 2020-09-02 NOTE — Assessment & Plan Note (Signed)
Continue flomax  

## 2020-09-02 NOTE — Progress Notes (Signed)
   Subjective:    Patient ID: Larry Fleming, male    DOB: [birth year], 64 y.o.   MRN: [id number]  Patient presents for Annual Exam (is fasting)   Dr. Denman George in [facility] is eye doctor doing well  Since Cataract surgery   Gout- he is on allopurinol 100mg  has not had any recent flares.  Hyperlipidemia-due for recheck on his cholesterol.   He is off the lipitor due to joint pain, he stopped a few months . He is taking the Omega 3   He is still using flomax for BPH    Immunizations- PNA shot today   Colonoscopy UTD   No new concerns today   Review Of Systems:  GEN- denies fatigue, fever, weight loss,weakness, recent illness HEENT- denies eye drainage, change in vision, nasal discharge, CVS- denies chest pain, palpitations RESP- denies SOB, cough, wheeze ABD- denies N/V, change in stools, abd pain GU- denies dysuria, hematuria, dribbling, incontinence MSK- denies joint pain, muscle aches, injury Neuro- denies headache, dizziness, syncope, seizure activity       Objective:    BP (!) 144/72   Pulse 60   Temp 98.6 F (37 C) (Temporal)   Resp 16   Ht 5\' 8"  (1.727 m)   Wt 245 lb (111.1 kg)   SpO2 96%   BMI 37.25 kg/m  GEN- NAD, alert and oriented x3 HEENT- PERRL, EOMI, non injected sclera, pink conjunctiva, MMM, oropharynx clear Neck- Supple, no thyromegaly CVS- RRR, no murmur RESP-CTAB ABD-NABS,soft,NT,ND EXT- No edema Pulses- Radial, DP- 2+        Assessment & Plan:      Problem List Items Addressed This Visit      Unprioritized   Benign prostatic hyperplasia    Continue flomax       Class 2 obesity    Discussed dietary changes Reduce fried food, baked goods        Gouty tophi of joint    No recent flares, continue allopurinol  Elevated BP readings, pt can check weekly, goal  < 140/90       Hyperlipidemia, mixed   Relevant Orders   Lipid panel    Other Visit Diagnoses    Routine general medical examination at a health care facility     -  Primary   CPE done, Fasting labs obtained, PSA to be done, TDAP sent to pharmacy, PNA vaccine given    Relevant Orders   Comprehensive metabolic panel   CBC with Differential/Platelet   Lipid panel   Pneumococcal polysaccharide vaccine 23-valent greater than or equal to 2yo subcutaneous/IM (Completed)   Prostate cancer screening       Relevant Orders   PSA   Elevated blood pressure reading       Need for prophylactic vaccination against Streptococcus pneumoniae (pneumococcus)       Relevant Orders   Pneumococcal polysaccharide vaccine 23-valent greater than or equal to 2yo subcutaneous/IM (Completed)      Note: This dictation was prepared with Dragon dictation along with smaller phrase technology. Any transcriptional errors that result from this process are unintentional.

## 2020-09-03 LAB — LIPID PANEL
Cholesterol: 174 mg/dL (ref <200)
HDL: 32 mg/dL — ABNORMAL LOW (ref >=40)
LDL Cholesterol (Calc): 113 mg/dL (calc) — ABNORMAL HIGH
Non-HDL Cholesterol (Calc): 142 mg/dL (calc) — ABNORMAL HIGH (ref <130)
Total CHOL/HDL Ratio: 5.4 (calc) — ABNORMAL HIGH (ref <5.0)
Triglycerides: 173 mg/dL — ABNORMAL HIGH (ref <150)

## 2020-09-03 LAB — COMPREHENSIVE METABOLIC PANEL
AG Ratio: 1.3 (calc) (ref 1.0–2.5)
ALT: 19 U/L (ref 9–46)
AST: 16 U/L (ref 10–35)
Albumin: 4 g/dL (ref 3.6–5.1)
Alkaline phosphatase (APISO): 74 U/L (ref 35–144)
BUN: 17 mg/dL (ref 7–25)
CO2: 20 mmol/L (ref 20–32)
Calcium: 9.6 mg/dL (ref 8.6–10.3)
Chloride: 108 mmol/L (ref 98–110)
Creat: 1.25 mg/dL (ref 0.70–1.25)
Globulin: 3 g/dL (calc) (ref 1.9–3.7)
Glucose, Bld: 104 mg/dL — ABNORMAL HIGH (ref 65–99)
Potassium: 4.7 mmol/L (ref 3.5–5.3)
Sodium: 139 mmol/L (ref 135–146)
Total Bilirubin: 0.5 mg/dL (ref 0.2–1.2)
Total Protein: 7 g/dL (ref 6.1–8.1)

## 2020-09-03 LAB — CBC WITH DIFFERENTIAL/PLATELET
Absolute Monocytes: 670 cells/uL (ref 200–950)
Basophils Absolute: 43 cells/uL (ref 0–200)
Basophils Relative: 0.6 %
Eosinophils Absolute: 223 cells/uL (ref 15–500)
Eosinophils Relative: 3.1 %
HCT: 48.6 % (ref 38.5–50.0)
Hemoglobin: 15.7 g/dL (ref 13.2–17.1)
Lymphs Abs: 1606 cells/uL (ref 850–3900)
MCH: 26.3 pg — ABNORMAL LOW (ref 27.0–33.0)
MCHC: 32.3 g/dL (ref 32.0–36.0)
MCV: 81.4 fL (ref 80.0–100.0)
MPV: 10.4 fL (ref 7.5–12.5)
Monocytes Relative: 9.3 %
Neutro Abs: 4658 cells/uL (ref 1500–7800)
Neutrophils Relative %: 64.7 %
Platelets: 326 10*3/uL (ref 140–400)
RBC: 5.97 10*6/uL — ABNORMAL HIGH (ref 4.20–5.80)
RDW: 17.9 % — ABNORMAL HIGH (ref 11.0–15.0)
Total Lymphocyte: 22.3 %
WBC: 7.2 10*3/uL (ref 3.8–10.8)

## 2020-09-03 LAB — PSA: PSA: 0.38 ng/mL (ref <=4.0)

## 2020-09-04 ENCOUNTER — Telehealth: Payer: Self-pay

## 2020-09-04 ENCOUNTER — Other Ambulatory Visit: Payer: Self-pay

## 2020-09-04 MED ORDER — PRAVASTATIN SODIUM 20 MG PO TABS: 20.0000 mg | ORAL_TABLET | Freq: Three times a day (TID) | ORAL | 3 refills | Status: DC

## 2020-09-04 MED ORDER — PRAVASTATIN SODIUM 20 MG PO TABS: ORAL_TABLET | ORAL | 2 refills | Status: AC

## 2020-09-04 NOTE — Progress Notes (Signed)
Medication corrected, pravastatin three times a week

## 2020-09-04 NOTE — Addendum Note (Signed)
Addended by: Milinda Antis F on: 09/04/2020 12:22 PM   Modules accepted: Orders

## 2020-09-04 NOTE — Telephone Encounter (Signed)
Spoke with pt regarding Lab results 

## 2020-09-13 ENCOUNTER — Other Ambulatory Visit: Payer: Self-pay | Admitting: *Deleted

## 2020-09-13 ENCOUNTER — Encounter: Payer: Self-pay | Admitting: *Deleted

## 2021-03-06 ENCOUNTER — Other Ambulatory Visit: Payer: Self-pay | Admitting: Family Medicine

## 2021-12-16 DIAGNOSIS — L209 Atopic dermatitis, unspecified: Secondary | ICD-10-CM | POA: Diagnosis not present

## 2022-01-15 DIAGNOSIS — R059 Cough, unspecified: Secondary | ICD-10-CM | POA: Diagnosis not present

## 2022-01-15 DIAGNOSIS — J309 Allergic rhinitis, unspecified: Secondary | ICD-10-CM | POA: Diagnosis not present

## 2022-01-15 DIAGNOSIS — J069 Acute upper respiratory infection, unspecified: Secondary | ICD-10-CM | POA: Diagnosis not present

## 2022-01-30 DIAGNOSIS — I10 Essential (primary) hypertension: Secondary | ICD-10-CM | POA: Diagnosis not present

## 2022-01-30 DIAGNOSIS — D509 Iron deficiency anemia, unspecified: Secondary | ICD-10-CM | POA: Diagnosis not present

## 2022-01-30 DIAGNOSIS — N400 Enlarged prostate without lower urinary tract symptoms: Secondary | ICD-10-CM | POA: Diagnosis not present

## 2022-02-04 DIAGNOSIS — E611 Iron deficiency: Secondary | ICD-10-CM | POA: Diagnosis not present

## 2022-02-04 DIAGNOSIS — I10 Essential (primary) hypertension: Secondary | ICD-10-CM | POA: Diagnosis not present

## 2022-02-04 DIAGNOSIS — M109 Gout, unspecified: Secondary | ICD-10-CM | POA: Diagnosis not present

## 2022-02-04 DIAGNOSIS — R944 Abnormal results of kidney function studies: Secondary | ICD-10-CM | POA: Diagnosis not present

## 2022-02-04 DIAGNOSIS — Z125 Encounter for screening for malignant neoplasm of prostate: Secondary | ICD-10-CM | POA: Diagnosis not present

## 2022-02-04 DIAGNOSIS — R809 Proteinuria, unspecified: Secondary | ICD-10-CM | POA: Diagnosis not present

## 2022-02-04 DIAGNOSIS — Z0001 Encounter for general adult medical examination with abnormal findings: Secondary | ICD-10-CM | POA: Diagnosis not present

## 2022-02-04 DIAGNOSIS — R350 Frequency of micturition: Secondary | ICD-10-CM | POA: Diagnosis not present

## 2022-02-04 DIAGNOSIS — E782 Mixed hyperlipidemia: Secondary | ICD-10-CM | POA: Diagnosis not present

## 2022-02-04 DIAGNOSIS — E875 Hyperkalemia: Secondary | ICD-10-CM | POA: Diagnosis not present

## 2022-02-04 DIAGNOSIS — N400 Enlarged prostate without lower urinary tract symptoms: Secondary | ICD-10-CM | POA: Diagnosis not present

## 2022-02-04 DIAGNOSIS — H259 Unspecified age-related cataract: Secondary | ICD-10-CM | POA: Diagnosis not present

## 2022-02-13 ENCOUNTER — Ambulatory Visit (INDEPENDENT_AMBULATORY_CARE_PROVIDER_SITE_OTHER): Payer: Managed Care, Other (non HMO) | Admitting: Urology

## 2022-02-13 ENCOUNTER — Encounter: Payer: Self-pay | Admitting: Urology

## 2022-02-13 VITALS — BP 153/80 | HR 71

## 2022-02-13 DIAGNOSIS — R351 Nocturia: Secondary | ICD-10-CM | POA: Diagnosis not present

## 2022-02-13 DIAGNOSIS — N401 Enlarged prostate with lower urinary tract symptoms: Secondary | ICD-10-CM

## 2022-02-13 DIAGNOSIS — N400 Enlarged prostate without lower urinary tract symptoms: Secondary | ICD-10-CM

## 2022-02-13 LAB — URINALYSIS, ROUTINE W REFLEX MICROSCOPIC
Bilirubin, UA: NEGATIVE
Glucose, UA: NEGATIVE
Ketones, UA: NEGATIVE
Leukocytes,UA: NEGATIVE
Nitrite, UA: NEGATIVE
Protein,UA: NEGATIVE
RBC, UA: NEGATIVE
Specific Gravity, UA: 1.02 (ref 1.005–1.030)
Urobilinogen, Ur: 0.2 mg/dL (ref 0.2–1.0)
pH, UA: 7 (ref 5.0–7.5)

## 2022-02-13 LAB — BLADDER SCAN AMB NON-IMAGING: Scan Result: 73

## 2022-02-13 MED ORDER — ALFUZOSIN HCL ER 10 MG PO TB24: 10.0000 mg | ORAL_TABLET | Freq: Every evening | ORAL | 11 refills | Status: DC

## 2022-02-13 NOTE — Progress Notes (Signed)
post void residual=73 ?

## 2022-02-13 NOTE — Patient Instructions (Signed)

## 2022-02-13 NOTE — Progress Notes (Signed)
? ?02/13/2022 ?11:26 AM  ? ?Larry Fleming ?[birth year] ?[id number] ? ?Referring provider: Leone Payor, FNP ?[address] ?[place] ?[place],  [place] [address] ? ?Nocturia ? ? ?HPI: ?Larry Fleming is a 66yo here for evaluation of nocturia. IPSS 20 QOL 4. Nocturia 4-5x. Urine stream weak, he has straining to urinate. He has been on flomax for over 4 years. He has not noticed an improve in is LUTS while on the flomax. No hematuria or dysuria. He has a feeling of incomplete emptying. He does not know if he has sleep apnea. He has never been evaluated for sleep apnea.  ? ? ?PMH: ?Past Medical History:  ?Diagnosis Date  ? BPH (benign prostatic hypertrophy)   ? Gout   ? Hyperlipidemia   ? Medical history non-contributory   ? ? ?Surgical History: ?Past Surgical History:  ?Procedure Laterality Date  ? CIRCUMCISION  30 years ago  ? COLONOSCOPY N/A 08/11/2016  ? Procedure: COLONOSCOPY;  Surgeon: Corbin Ade, MD;  [facility];  Service: Endoscopy;  Laterality: N/A;  1:15 PM  ? ? ?Home Medications:  ?Allergies as of 02/13/2022   ? ?   Reactions  ? Lipitor [atorvastatin]   ? Myalgias   ? ?  ? ?  ?Medication List  ?  ? ?  ? Accurate as of Feb 13, 2022 11:26 AM. If you have any questions, ask your nurse or doctor.  ?  ?  ? ?  ? ?allopurinol 100 MG tablet ?Commonly known as: ZYLOPRIM ?TAKE 2 TABLETS(200 MG) BY MOUTH DAILY ?  ?allopurinol 300 MG tablet ?Commonly known as: ZYLOPRIM ?Take 300 mg by mouth daily. ?  ?aspirin EC 81 MG tablet ?Take 81 mg by mouth daily. ?  ?cetirizine 10 MG tablet ?Commonly known as: ZYRTEC ?Take 10 mg by mouth daily. ?  ?naproxen sodium 220 MG tablet ?Commonly known as: ALEVE ?Take 220 mg by mouth 2 (two) times daily with a meal. ?  ?Omega 3 1000 MG Caps ?Take 1 capsule (1,000 mg total) by mouth 2 (two) times daily. ?  ?pravastatin 20 MG tablet ?Commonly known as: PRAVACHOL ?Take 1 tablet three times a week ?  ?tamsulosin 0.4 MG Caps capsule ?Commonly known as: FLOMAX ?TAKE 1 CAPSULE(0.4 MG) BY MOUTH AT  BEDTIME ?  ? ?  ? ? ?Allergies:  ?Allergies  ?Allergen Reactions  ? Lipitor [Atorvastatin]   ?  Myalgias   ? ? ?Family History: ?Family History  ?Problem Relation Age of Onset  ? Diabetes Brother   ? Stroke Brother   ? Cancer Brother   ?     prostate  ? Kidney disease Sister   ? ? ?Social History:  reports that he quit smoking about 24 years ago. His smoking use included cigarettes. He has a 4.00 pack-year smoking history. He has never used smokeless tobacco. He reports that he does not drink alcohol and does not use drugs. ? ?ROS: ?All other review of systems were reviewed and are negative except what is noted above in HPI ? ?Physical Exam: ?BP (!) 153/80   Pulse 71   ?Constitutional:  Alert and oriented, No acute distress. ?HEENT: [place] AT, moist mucus membranes.  Trachea midline, no masses. ?Cardiovascular: No clubbing, cyanosis, or edema. ?Respiratory: Normal respiratory effort, no increased work of breathing. ?GI: Abdomen is soft, nontender, nondistended, no abdominal masses ?GU: No CVA tenderness. Circumcised phallus. No masses/lesions on penis, testis, scrotum. Prostate 40g smooth no nodules no induration.  ?Lymph: No cervical or  inguinal lymphadenopathy. ?Skin: No rashes, bruises or suspicious lesions. ?Neurologic: Grossly intact, no focal deficits, moving all 4 extremities. ?Psychiatric: Normal mood and affect. ? ?Laboratory Data: ?Lab Results  ?Component Value Date  ? WBC 7.2 09/02/2020  ? HGB 15.7 09/02/2020  ? HCT 48.6 09/02/2020  ? MCV 81.4 09/02/2020  ? PLT 326 09/02/2020  ? ? ?Lab Results  ?Component Value Date  ? CREATININE 1.25 09/02/2020  ? ? ?Lab Results  ?Component Value Date  ? PSA 0.38 09/02/2020  ? PSA 0.3 08/15/2019  ? PSA 0.32 07/12/2013  ? ? ?Lab Results  ?Component Value Date  ? TESTOSTERONE 172 (L) 02/21/2014  ? ? ?Lab Results  ?Component Value Date  ? HGBA1C 4.7 02/07/2020  ? ? ?Urinalysis ?   ?Component Value Date/Time  ? COLORURINE YELLOW 08/15/2019 1119  ? APPEARANCEUR CLEAR 08/15/2019  1119  ? LABSPEC 1.025 08/15/2019 1119  ? PHURINE 5.5 08/15/2019 1119  ? GLUCOSEU NEGATIVE 08/15/2019 1119  ? HGBUR NEGATIVE 08/15/2019 1119  ? BILIRUBINUR NEG 07/12/2013 1542  ? [name] NEGATIVE 08/15/2019 1119  ? PROTEINUR NEGATIVE 08/15/2019 1119  ? UROBILINOGEN 0.2 07/12/2013 1542  ? NITRITE NEGATIVE 08/15/2019 1119  ? LEUKOCYTESUR NEGATIVE 08/15/2019 1119  ? ? ?Lab Results  ?Component Value Date  ? BACTERIA NONE SEEN 06/24/2017  ? ? ?Pertinent Imaging: ? ?No results found for this or any previous visit. ? ?No results found for this or any previous visit. ? ?No results found for this or any previous visit. ? ?No results found for this or any previous visit. ? ?No results found for this or any previous visit. ? ?No results found for this or any previous visit. ? ?No results found for this or any previous visit. ? ?No results found for this or any previous visit. ? ? ?Assessment & Plan:   ? ?1. Benign prostatic hyperplasia, unspecified whether lower urinary tract symptoms present ?-We will trial uroxatral 10mg  daily ?- BLADDER SCAN AMB NON-IMAGING ?- Urinalysis, Routine w reflex microscopic ? ?2. Nocturia ?-Uroxatral 10mg  ? ?No follow-ups on file. ? ? , MD ? ?[facility] [place] ?  ?

## 2022-02-23 ENCOUNTER — Encounter: Payer: Self-pay | Admitting: Urology

## 2022-02-23 ENCOUNTER — Ambulatory Visit: Payer: Managed Care, Other (non HMO) | Admitting: Urology

## 2022-02-23 VITALS — BP 165/92 | HR 82

## 2022-02-23 DIAGNOSIS — N401 Enlarged prostate with lower urinary tract symptoms: Secondary | ICD-10-CM

## 2022-02-23 DIAGNOSIS — N400 Enlarged prostate without lower urinary tract symptoms: Secondary | ICD-10-CM

## 2022-02-23 DIAGNOSIS — R351 Nocturia: Secondary | ICD-10-CM | POA: Diagnosis not present

## 2022-02-23 MED ORDER — NYSTATIN-TRIAMCINOLONE 100000-0.1 UNIT/GM-% EX OINT: 1.0000 "application " | TOPICAL_OINTMENT | Freq: Two times a day (BID) | CUTANEOUS | 1 refills | Status: AC

## 2022-02-23 MED ORDER — SILODOSIN 8 MG PO CAPS: 8.0000 mg | ORAL_CAPSULE | Freq: Every day | ORAL | 11 refills | Status: DC

## 2022-02-23 NOTE — Patient Instructions (Signed)

## 2022-02-23 NOTE — Progress Notes (Signed)
? ?02/23/2022 ?10:18 AM  ? ?Larry Fleming ?December 23, 1955 ?[id number] ? ?Referring provider: Leone Payor, FNP ?[address] ?Larry Fleming ?[place],  [place] [address] ? ?Followup BPH and rash ? ? ?HPI: ?Mr Larry Fleming is a 66yo here for followup for BPH with Nocturia. We started uroxatral 10mg  and 3 days after starting uroxatral he develoepd hives. Nocturia decreased to 1x. IPSS 10 QOl 2. He previously failed flomax 0.4mg  daily.  No other complaitns today ? ? ?PMH: ?Past Medical History:  ?Diagnosis Date  ? BPH (benign prostatic hypertrophy)   ? Gout   ? Hyperlipidemia   ? Medical history non-contributory   ? ? ?Surgical History: ?Past Surgical History:  ?Procedure Laterality Date  ? CIRCUMCISION  30 years ago  ? COLONOSCOPY N/A 08/11/2016  ? Procedure: COLONOSCOPY;  Surgeon: 08/13/2016, MD;  [facility];  Service: Endoscopy;  Laterality: N/A;  1:15 PM  ? ? ?Home Medications:  ?Allergies as of 02/23/2022   ? ?   Reactions  ? Lipitor [atorvastatin]   ? Myalgias   ? Uroxatral [alfuzosin] Hives  ? ?  ? ?  ?Medication List  ?  ? ?  ? Accurate as of Feb 23, 2022 10:18 AM. If you have any questions, ask your nurse or doctor.  ?  ?  ? ?  ? ?STOP taking these medications   ? ?alfuzosin 10 MG 24 hr tablet ?Commonly known as: UROXATRAL ?  ? ?  ? ?TAKE these medications   ? ?allopurinol 100 MG tablet ?Commonly known as: ZYLOPRIM ?TAKE 2 TABLETS(200 MG) BY MOUTH DAILY ?  ?allopurinol 300 MG tablet ?Commonly known as: ZYLOPRIM ?Take 300 mg by mouth daily. ?  ?aspirin EC 81 MG tablet ?Take 81 mg by mouth daily. ?  ?cetirizine 10 MG tablet ?Commonly known as: ZYRTEC ?Take 10 mg by mouth daily. ?  ?naproxen sodium 220 MG tablet ?Commonly known as: ALEVE ?Take 220 mg by mouth 2 (two) times daily with a meal. ?  ?Omega 3 1000 MG Caps ?Take 1 capsule (1,000 mg total) by mouth 2 (two) times daily. ?  ?pravastatin 20 MG tablet ?Commonly known as: PRAVACHOL ?Take 1 tablet three times a week ?  ?tamsulosin 0.4 MG Caps capsule ?Commonly known as:  FLOMAX ?TAKE 1 CAPSULE(0.4 MG) BY MOUTH AT BEDTIME ?  ? ?  ? ? ?Allergies:  ?Allergies  ?Allergen Reactions  ? Lipitor [Atorvastatin]   ?  Myalgias   ? Uroxatral [Alfuzosin] Hives  ? ? ?Family History: ?Family History  ?Problem Relation Age of Onset  ? Diabetes Brother   ? Stroke Brother   ? Cancer Brother   ?     prostate  ? Kidney disease Sister   ? ? ?Social History:  reports that he quit smoking about 24 years ago. His smoking use included cigarettes. He has a 4.00 pack-year smoking history. He has never used smokeless tobacco. He reports that he does not drink alcohol and does not use drugs. ? ?ROS: ?All other review of systems were reviewed and are negative except what is noted above in HPI ? ?Physical Exam: ?BP (!) 165/92   Pulse 82   ?Constitutional:  Alert and oriented, No acute distress. ?HEENT: [place] AT, moist mucus membranes.  Trachea midline, no masses. ?Cardiovascular: No clubbing, cyanosis, or edema. ?Respiratory: Normal respiratory effort, no increased work of breathing. ?GI: Abdomen is soft, nontender, nondistended, no abdominal masses ?GU: No CVA tenderness.  ?Lymph: No cervical or inguinal lymphadenopathy. ?Skin: No  rashes, bruises or suspicious lesions. ?Neurologic: Grossly intact, no focal deficits, moving all 4 extremities. ?Psychiatric: Normal mood and affect. ? ?Laboratory Data: ?Lab Results  ?Component Value Date  ? WBC 7.2 09/02/2020  ? HGB 15.7 09/02/2020  ? HCT 48.6 09/02/2020  ? MCV 81.4 09/02/2020  ? PLT 326 09/02/2020  ? ? ?Lab Results  ?Component Value Date  ? CREATININE 1.25 09/02/2020  ? ? ?Lab Results  ?Component Value Date  ? PSA 0.38 09/02/2020  ? PSA 0.3 08/15/2019  ? PSA 0.32 07/12/2013  ? ? ?Lab Results  ?Component Value Date  ? TESTOSTERONE 172 (L) 02/21/2014  ? ? ?Lab Results  ?Component Value Date  ? HGBA1C 4.7 02/07/2020  ? ? ?Urinalysis ?   ?Component Value Date/Time  ? COLORURINE YELLOW 08/15/2019 1119  ? APPEARANCEUR Clear 02/13/2022 1206  ? LABSPEC 1.025 08/15/2019 1119   ? PHURINE 5.5 08/15/2019 1119  ? GLUCOSEU Negative 02/13/2022 1206  ? HGBUR NEGATIVE 08/15/2019 1119  ? BILIRUBINUR Negative 02/13/2022 1206  ? [name] NEGATIVE 08/15/2019 1119  ? PROTEINUR Negative 02/13/2022 1206  ? PROTEINUR NEGATIVE 08/15/2019 1119  ? UROBILINOGEN 0.2 07/12/2013 1542  ? NITRITE Negative 02/13/2022 1206  ? NITRITE NEGATIVE 08/15/2019 1119  ? LEUKOCYTESUR Negative 02/13/2022 1206  ? LEUKOCYTESUR NEGATIVE 08/15/2019 1119  ? ? ?Lab Results  ?Component Value Date  ? LABMICR Comment 02/13/2022  ? BACTERIA NONE SEEN 06/24/2017  ? ? ?Pertinent Imaging: ? ?No results found for this or any previous visit. ? ?No results found for this or any previous visit. ? ?No results found for this or any previous visit. ? ?No results found for this or any previous visit. ? ?No results found for this or any previous visit. ? ?No results found for this or any previous visit. ? ?No results found for this or any previous visit. ? ?No results found for this or any previous visit. ? ? ?Assessment & Plan:   ? ?1. Benign prostatic hyperplasia, unspecified whether lower urinary tract symptoms present ?-we will start rapaflo 8mg  daily ?- Urinalysis, Routine w reflex microscopic ? ?2. Nocturia ?-rapaflo 8mg  daily ? ? ?No follow-ups on file. ? ? , MD ? ?[facility] [place] ? ? ?

## 2022-03-10 ENCOUNTER — Other Ambulatory Visit (HOSPITAL_COMMUNITY): Payer: Self-pay | Admitting: Nurse Practitioner

## 2022-03-10 ENCOUNTER — Other Ambulatory Visit: Payer: Self-pay | Admitting: Nurse Practitioner

## 2022-03-10 DIAGNOSIS — R1012 Left upper quadrant pain: Secondary | ICD-10-CM

## 2022-03-10 DIAGNOSIS — R1032 Left lower quadrant pain: Secondary | ICD-10-CM

## 2022-03-10 DIAGNOSIS — M545 Low back pain, unspecified: Secondary | ICD-10-CM | POA: Diagnosis not present

## 2022-03-11 ENCOUNTER — Ambulatory Visit (HOSPITAL_COMMUNITY): Payer: Managed Care, Other (non HMO)

## 2022-03-11 ENCOUNTER — Encounter (HOSPITAL_COMMUNITY): Payer: Self-pay

## 2022-03-11 ENCOUNTER — Ambulatory Visit (HOSPITAL_COMMUNITY)
Admission: RE | Admit: 2022-03-11 | Discharge: 2022-03-11 | Disposition: A | Discharge status: Home / Self Care | Payer: Managed Care, Other (non HMO) | Source: Ambulatory Visit | Attending: Nurse Practitioner | Admitting: Nurse Practitioner

## 2022-03-11 DIAGNOSIS — R1032 Left lower quadrant pain: Secondary | ICD-10-CM | POA: Diagnosis not present

## 2022-03-11 DIAGNOSIS — K824 Cholesterolosis of gallbladder: Secondary | ICD-10-CM | POA: Diagnosis not present

## 2022-03-11 DIAGNOSIS — R1012 Left upper quadrant pain: Secondary | ICD-10-CM | POA: Insufficient documentation

## 2022-03-13 ENCOUNTER — Ambulatory Visit: Payer: Managed Care, Other (non HMO) | Admitting: Urology

## 2022-03-16 ENCOUNTER — Ambulatory Visit (HOSPITAL_COMMUNITY): Payer: Managed Care, Other (non HMO)

## 2022-03-17 ENCOUNTER — Ambulatory Visit (HOSPITAL_COMMUNITY): Payer: Managed Care, Other (non HMO)

## 2022-03-31 ENCOUNTER — Ambulatory Visit: Payer: Managed Care, Other (non HMO) | Admitting: Urology

## 2022-04-17 ENCOUNTER — Ambulatory Visit: Payer: Managed Care, Other (non HMO) | Admitting: Urology

## 2022-04-17 VITALS — BP 151/60 | HR 58

## 2022-04-17 DIAGNOSIS — N400 Enlarged prostate without lower urinary tract symptoms: Secondary | ICD-10-CM

## 2022-04-17 DIAGNOSIS — R351 Nocturia: Secondary | ICD-10-CM | POA: Diagnosis not present

## 2022-04-17 DIAGNOSIS — N401 Enlarged prostate with lower urinary tract symptoms: Secondary | ICD-10-CM | POA: Diagnosis not present

## 2022-04-17 LAB — URINALYSIS, ROUTINE W REFLEX MICROSCOPIC
Bilirubin, UA: NEGATIVE
Glucose, UA: NEGATIVE
Ketones, UA: NEGATIVE
Leukocytes,UA: NEGATIVE
Nitrite, UA: NEGATIVE
Protein,UA: NEGATIVE
RBC, UA: NEGATIVE
Specific Gravity, UA: 1.025 (ref 1.005–1.030)
Urobilinogen, Ur: 1 mg/dL (ref 0.2–1.0)
pH, UA: 5.5 (ref 5.0–7.5)

## 2022-04-17 MED ORDER — SILODOSIN 8 MG PO CAPS: 8.0000 mg | ORAL_CAPSULE | Freq: Every day | ORAL | 11 refills | Status: DC

## 2022-04-17 NOTE — Progress Notes (Signed)
04/17/2022 10:59 AM   Larry Fleming 08/17/1956 [id number]  Referring provider: Leone Payor, FNP [address] [place],  [place] [address]  nocturia   HPI: Mr Choyce is a 66yo here for followup for  BPH with nocturia. IPSS 14 QOL 2 on rapaflo 8mg . Nocturia 1-2x down from 7-8x. Urine stream strong. No straining to urinate. No other complaints today   PMH: Past Medical History:  Diagnosis Date   BPH (benign prostatic hypertrophy)    Gout    Hyperlipidemia    Medical history non-contributory     Surgical History: Past Surgical History:  Procedure Laterality Date   CIRCUMCISION  30 years ago   COLONOSCOPY N/A 08/11/2016   Procedure: COLONOSCOPY;  Surgeon: 08/13/2016, MD;  [facility];  Service: Endoscopy;  Laterality: N/A;  1:15 PM    Home Medications:  Allergies as of 04/17/2022       Reactions   Lipitor [atorvastatin]    Myalgias    Uroxatral [alfuzosin] Hives        Medication List        Accurate as of April 17, 2022 10:59 AM. If you have any questions, ask your nurse or doctor.          allopurinol 100 MG tablet Commonly known as: ZYLOPRIM TAKE 2 TABLETS(200 MG) BY MOUTH DAILY   allopurinol 300 MG tablet Commonly known as: ZYLOPRIM Take 300 mg by mouth daily.   aspirin EC 81 MG tablet Take 81 mg by mouth daily.   cetirizine 10 MG tablet Commonly known as: ZYRTEC Take 10 mg by mouth daily.   naproxen sodium 220 MG tablet Commonly known as: ALEVE Take 220 mg by mouth 2 (two) times daily with a meal.   nystatin-triamcinolone ointment Commonly known as: MYCOLOG Apply 1 application. topically 2 (two) times daily.   Omega 3 1000 MG Caps Take 1 capsule (1,000 mg total) by mouth 2 (two) times daily.   pravastatin 20 MG tablet Commonly known as: PRAVACHOL Take 1 tablet three times a week   silodosin 8 MG Caps capsule Commonly known as: RAPAFLO Take 1 capsule (8 mg total) by mouth at bedtime.   tamsulosin 0.4 MG Caps  capsule Commonly known as: FLOMAX TAKE 1 CAPSULE(0.4 MG) BY MOUTH AT BEDTIME        Allergies:  Allergies  Allergen Reactions   Lipitor [Atorvastatin]     Myalgias    Uroxatral [Alfuzosin] Hives    Family History: Family History  Problem Relation Age of Onset   Diabetes Brother    Stroke Brother    Cancer Brother        prostate   Kidney disease Sister     Social History:  reports that he quit smoking about 24 years ago. His smoking use included cigarettes. He has a 4.00 pack-year smoking history. He has never used smokeless tobacco. He reports that he does not drink alcohol and does not use drugs.  ROS: All other review of systems were reviewed and are negative except what is noted above in HPI  Physical Exam: BP (!) 151/60   Pulse (!) 58   Constitutional:  Alert and oriented, No acute distress. HEENT: [place] AT, moist mucus membranes.  Trachea midline, no masses. Cardiovascular: No clubbing, cyanosis, or edema. Respiratory: Normal respiratory effort, no increased work of breathing. GI: Abdomen is soft, nontender, nondistended, no abdominal masses GU: No CVA tenderness.  Lymph: No cervical or inguinal lymphadenopathy. Skin: No rashes, bruises  or suspicious lesions. Neurologic: Grossly intact, no focal deficits, moving all 4 extremities. Psychiatric: Normal mood and affect.  Laboratory Data: Lab Results  Component Value Date   WBC 7.2 09/02/2020   HGB 15.7 09/02/2020   HCT 48.6 09/02/2020   MCV 81.4 09/02/2020   PLT 326 09/02/2020    Lab Results  Component Value Date   CREATININE 1.25 09/02/2020    Lab Results  Component Value Date   PSA 0.38 09/02/2020   PSA 0.3 08/15/2019   PSA 0.32 07/12/2013    Lab Results  Component Value Date   TESTOSTERONE 172 (L) 02/21/2014    Lab Results  Component Value Date   HGBA1C 4.7 02/07/2020    Urinalysis    Component Value Date/Time   COLORURINE YELLOW 08/15/2019 1119   APPEARANCEUR Clear 02/13/2022 1206    LABSPEC 1.025 08/15/2019 1119   PHURINE 5.5 08/15/2019 1119   GLUCOSEU Negative 02/13/2022 1206   HGBUR NEGATIVE 08/15/2019 1119   BILIRUBINUR Negative 02/13/2022 1206   KETONESUR NEGATIVE 08/15/2019 1119   PROTEINUR Negative 02/13/2022 1206   PROTEINUR NEGATIVE 08/15/2019 1119   UROBILINOGEN 0.2 07/12/2013 1542   NITRITE Negative 02/13/2022 1206   NITRITE NEGATIVE 08/15/2019 1119   LEUKOCYTESUR Negative 02/13/2022 1206   LEUKOCYTESUR NEGATIVE 08/15/2019 1119    Lab Results  Component Value Date   LABMICR Comment 02/13/2022   BACTERIA NONE SEEN 06/24/2017    Pertinent Imaging:  No results found for this or any previous visit.  No results found for this or any previous visit.  No results found for this or any previous visit.  No results found for this or any previous visit.  No results found for this or any previous visit.  No results found for this or any previous visit.  No results found for this or any previous visit.  No results found for this or any previous visit.   Assessment & Plan:    1. Benign prostatic hyperplasia, unspecified whether lower urinary tract symptoms present -continue rapaflo 8mg  daily - Urinalysis, Routine w reflex microscopic  2. Nocturia -continue rapaflo 8mg     No follow-ups on file.  , MD  [facility] [place]

## 2022-04-17 NOTE — Patient Instructions (Signed)

## 2022-04-30 ENCOUNTER — Encounter: Payer: Self-pay | Admitting: Urology

## 2022-06-30 DIAGNOSIS — Z87891 Personal history of nicotine dependence: Secondary | ICD-10-CM | POA: Diagnosis not present

## 2022-06-30 DIAGNOSIS — E669 Obesity, unspecified: Secondary | ICD-10-CM | POA: Diagnosis not present

## 2022-06-30 DIAGNOSIS — M109 Gout, unspecified: Secondary | ICD-10-CM | POA: Diagnosis not present

## 2022-06-30 DIAGNOSIS — Z791 Long term (current) use of non-steroidal anti-inflammatories (NSAID): Secondary | ICD-10-CM | POA: Diagnosis not present

## 2022-06-30 DIAGNOSIS — Z6838 Body mass index (BMI) 38.0-38.9, adult: Secondary | ICD-10-CM | POA: Diagnosis not present

## 2022-06-30 DIAGNOSIS — Z7982 Long term (current) use of aspirin: Secondary | ICD-10-CM | POA: Diagnosis not present

## 2022-06-30 DIAGNOSIS — N400 Enlarged prostate without lower urinary tract symptoms: Secondary | ICD-10-CM | POA: Diagnosis not present

## 2022-07-10 ENCOUNTER — Ambulatory Visit: Payer: Medicare HMO | Admitting: Urology

## 2022-07-10 VITALS — BP 143/70 | HR 58

## 2022-07-10 DIAGNOSIS — N400 Enlarged prostate without lower urinary tract symptoms: Secondary | ICD-10-CM

## 2022-07-10 MED ORDER — SILODOSIN 8 MG PO CAPS: 8.0000 mg | ORAL_CAPSULE | Freq: Every day | ORAL | 11 refills | Status: DC

## 2022-07-13 NOTE — Progress Notes (Signed)
Patient rescheduled

## 2022-07-30 DIAGNOSIS — R972 Elevated prostate specific antigen [PSA]: Secondary | ICD-10-CM | POA: Diagnosis not present

## 2022-07-30 DIAGNOSIS — E782 Mixed hyperlipidemia: Secondary | ICD-10-CM | POA: Diagnosis not present

## 2022-07-30 DIAGNOSIS — D509 Iron deficiency anemia, unspecified: Secondary | ICD-10-CM | POA: Diagnosis not present

## 2022-08-06 DIAGNOSIS — E669 Obesity, unspecified: Secondary | ICD-10-CM | POA: Diagnosis not present

## 2022-08-06 DIAGNOSIS — R944 Abnormal results of kidney function studies: Secondary | ICD-10-CM | POA: Diagnosis not present

## 2022-08-06 DIAGNOSIS — H259 Unspecified age-related cataract: Secondary | ICD-10-CM | POA: Diagnosis not present

## 2022-08-06 DIAGNOSIS — E611 Iron deficiency: Secondary | ICD-10-CM | POA: Diagnosis not present

## 2022-08-06 DIAGNOSIS — I10 Essential (primary) hypertension: Secondary | ICD-10-CM | POA: Diagnosis not present

## 2022-08-06 DIAGNOSIS — N400 Enlarged prostate without lower urinary tract symptoms: Secondary | ICD-10-CM | POA: Diagnosis not present

## 2022-08-06 DIAGNOSIS — Z125 Encounter for screening for malignant neoplasm of prostate: Secondary | ICD-10-CM | POA: Diagnosis not present

## 2022-08-06 DIAGNOSIS — E782 Mixed hyperlipidemia: Secondary | ICD-10-CM | POA: Diagnosis not present

## 2022-09-29 ENCOUNTER — Ambulatory Visit: Payer: Managed Care, Other (non HMO) | Admitting: Urology

## 2022-10-21 ENCOUNTER — Ambulatory Visit: Payer: Managed Care, Other (non HMO) | Admitting: Urology

## 2022-10-21 DIAGNOSIS — N400 Enlarged prostate without lower urinary tract symptoms: Secondary | ICD-10-CM

## 2022-11-29 ENCOUNTER — Other Ambulatory Visit: Payer: Self-pay | Admitting: Urology

## 2023-02-03 DIAGNOSIS — R972 Elevated prostate specific antigen [PSA]: Secondary | ICD-10-CM | POA: Diagnosis not present

## 2023-02-03 DIAGNOSIS — I10 Essential (primary) hypertension: Secondary | ICD-10-CM | POA: Diagnosis not present

## 2023-02-03 DIAGNOSIS — E782 Mixed hyperlipidemia: Secondary | ICD-10-CM | POA: Diagnosis not present

## 2023-02-03 DIAGNOSIS — E611 Iron deficiency: Secondary | ICD-10-CM | POA: Diagnosis not present

## 2023-02-09 DIAGNOSIS — H269 Unspecified cataract: Secondary | ICD-10-CM | POA: Diagnosis not present

## 2023-02-09 DIAGNOSIS — I10 Essential (primary) hypertension: Secondary | ICD-10-CM | POA: Diagnosis not present

## 2023-02-09 DIAGNOSIS — N400 Enlarged prostate without lower urinary tract symptoms: Secondary | ICD-10-CM | POA: Diagnosis not present

## 2023-02-09 DIAGNOSIS — E669 Obesity, unspecified: Secondary | ICD-10-CM | POA: Diagnosis not present

## 2023-02-09 DIAGNOSIS — Z79899 Other long term (current) drug therapy: Secondary | ICD-10-CM | POA: Diagnosis not present

## 2023-02-09 DIAGNOSIS — E782 Mixed hyperlipidemia: Secondary | ICD-10-CM | POA: Diagnosis not present

## 2023-02-09 DIAGNOSIS — Z713 Dietary counseling and surveillance: Secondary | ICD-10-CM | POA: Diagnosis not present

## 2023-02-09 DIAGNOSIS — Z7182 Exercise counseling: Secondary | ICD-10-CM | POA: Diagnosis not present

## 2023-02-09 DIAGNOSIS — Z87891 Personal history of nicotine dependence: Secondary | ICD-10-CM | POA: Diagnosis not present

## 2023-02-09 DIAGNOSIS — Z9849 Cataract extraction status, unspecified eye: Secondary | ICD-10-CM | POA: Diagnosis not present

## 2023-02-09 DIAGNOSIS — R944 Abnormal results of kidney function studies: Secondary | ICD-10-CM | POA: Diagnosis not present

## 2023-02-09 DIAGNOSIS — E611 Iron deficiency: Secondary | ICD-10-CM | POA: Diagnosis not present

## 2023-02-19 ENCOUNTER — Telehealth: Payer: Self-pay

## 2023-02-19 NOTE — Telephone Encounter (Signed)
Patient states the silodosin (RAPAFLO) 8 MG CAPS capsule causes a itchy rash on tongue and tip of penis.  Stopped taking medication.  The rash cleared up.   Started taking again. The rash came back. Stopped silodosin.  Wants to start back on Tamsulosin until next appointment in June.  Please advise.  Call back:  [phone] (M)   Thank you.

## 2023-02-23 NOTE — Telephone Encounter (Signed)
Patient stopped by office to check if Tamsulosin has been approved and called into pharmacy.  Please advise.  Thank you.

## 2023-02-23 NOTE — Telephone Encounter (Signed)
Please see first message in thread. Looks like patient was having and allergic reaction to the Rapaflo. Patient request to go back on Flomax until he comes back to see you in June. Ok to send in Flomax? If so qd of bid?

## 2023-03-01 NOTE — Telephone Encounter (Signed)
Patient came by office to say he wants to start back on Tamsulosin 0.4mg .   He stated he cannot take  silodosin (RAPAFLO) 8 MG CAPS capsule .    Currently not taking any medication.  Wants to start back on  Tamsulosin 0.4mg . Will come to June appointment to discuss.

## 2023-03-03 ENCOUNTER — Other Ambulatory Visit: Payer: Self-pay

## 2023-03-03 MED ORDER — TAMSULOSIN HCL 0.4 MG PO CAPS: 0.4000 mg | ORAL_CAPSULE | Freq: Every day | ORAL | 11 refills | Status: DC

## 2023-03-04 NOTE — Telephone Encounter (Signed)
Rx sent in per Dr. Ronne Binning

## 2023-03-22 ENCOUNTER — Encounter: Payer: Self-pay | Admitting: Urology

## 2023-03-22 ENCOUNTER — Ambulatory Visit (INDEPENDENT_AMBULATORY_CARE_PROVIDER_SITE_OTHER): Payer: Medicare HMO | Admitting: Urology

## 2023-03-22 VITALS — BP 166/71 | HR 65

## 2023-03-22 DIAGNOSIS — R351 Nocturia: Secondary | ICD-10-CM

## 2023-03-22 DIAGNOSIS — N400 Enlarged prostate without lower urinary tract symptoms: Secondary | ICD-10-CM

## 2023-03-22 LAB — URINALYSIS, ROUTINE W REFLEX MICROSCOPIC
Bilirubin, UA: NEGATIVE
Glucose, UA: NEGATIVE
Ketones, UA: NEGATIVE
Leukocytes,UA: NEGATIVE
Nitrite, UA: NEGATIVE
Protein,UA: NEGATIVE
RBC, UA: NEGATIVE
Specific Gravity, UA: 1.025 (ref 1.005–1.030)
Urobilinogen, Ur: 0.2 mg/dL (ref 0.2–1.0)
pH, UA: 5.5 (ref 5.0–7.5)

## 2023-03-22 MED ORDER — TAMSULOSIN HCL 0.4 MG PO CAPS: 0.4000 mg | ORAL_CAPSULE | Freq: Two times a day (BID) | ORAL | 11 refills | Status: DC

## 2023-03-22 NOTE — Progress Notes (Unsigned)
03/22/2023 11:13 AM   Larry Fleming 1955/11/17 [id number]  Referring provider: Leone Payor, FNP [address] [place],  [place] [address]  No chief complaint on file.   HPI: Larry Fleming is a 66yo here for followup for BPH and weak urinary stream. He was previously on rapalfo but developed mouth sores and stopped the medication. He is back on flomax 0.4mg  BID. IPPS 19 QOl 3 on flomax daily. Urine stream is fair.   PMH: Past Medical History:  Diagnosis Date   BPH (benign prostatic hypertrophy)    Gout    Hyperlipidemia    Medical history non-contributory     Surgical History: Past Surgical History:  Procedure Laterality Date   CIRCUMCISION  30 years ago   COLONOSCOPY N/A 08/11/2016   Procedure: COLONOSCOPY;  Surgeon: Corbin Ade, MD;  [facility];  Service: Endoscopy;  Laterality: N/A;  1:15 PM    Home Medications:  Allergies as of 03/22/2023       Reactions   Lipitor [atorvastatin]    Myalgias    Uroxatral [alfuzosin] Hives        Medication List        Accurate as of March 22, 2023 11:13 AM. If you have any questions, ask your nurse or doctor.          allopurinol 100 MG tablet Commonly known as: ZYLOPRIM TAKE 2 TABLETS(200 MG) BY MOUTH DAILY   allopurinol 300 MG tablet Commonly known as: ZYLOPRIM Take 300 mg by mouth daily.   aspirin EC 81 MG tablet Take 81 mg by mouth daily.   cetirizine 10 MG tablet Commonly known as: ZYRTEC Take 10 mg by mouth daily.   naproxen sodium 220 MG tablet Commonly known as: ALEVE Take 220 mg by mouth 2 (two) times daily with a meal.   nystatin-triamcinolone ointment Commonly known as: MYCOLOG Apply 1 application. topically 2 (two) times daily.   Omega 3 1000 MG Caps Take 1 capsule (1,000 mg total) by mouth 2 (two) times daily.   pravastatin 20 MG tablet Commonly known as: PRAVACHOL Take 1 tablet three times a week   silodosin 8 MG Caps capsule Commonly known as: RAPAFLO TAKE 1  CAPSULE(8 MG) BY MOUTH AT BEDTIME   tamsulosin 0.4 MG Caps capsule Commonly known as: FLOMAX Take 1 capsule (0.4 mg total) by mouth daily.        Allergies:  Allergies  Allergen Reactions   Lipitor [Atorvastatin]     Myalgias    Uroxatral [Alfuzosin] Hives    Family History: Family History  Problem Relation Age of Onset   Diabetes Brother    Stroke Brother    Cancer Brother        prostate   Kidney disease Sister     Social History:  reports that he quit smoking about 25 years ago. His smoking use included cigarettes. He has a 4.00 pack-year smoking history. He has never used smokeless tobacco. He reports that he does not drink alcohol and does not use drugs.  ROS: All other review of systems were reviewed and are negative except what is noted above in HPI  Physical Exam: BP (!) 154/72   Pulse 80   Constitutional:  Alert and oriented, No acute distress. HEENT: [place] AT, moist mucus membranes.  Trachea midline, no masses. Cardiovascular: No clubbing, cyanosis, or edema. Respiratory: Normal respiratory effort, no increased work of breathing. GI: Abdomen is soft, nontender, nondistended, no abdominal masses GU: No CVA tenderness.  Lymph: No cervical or inguinal lymphadenopathy. Skin: No rashes, bruises or suspicious lesions. Neurologic: Grossly intact, no focal deficits, moving all 4 extremities. Psychiatric: Normal mood and affect.  Laboratory Data: Lab Results  Component Value Date   WBC 7.2 09/02/2020   HGB 15.7 09/02/2020   HCT 48.6 09/02/2020   MCV 81.4 09/02/2020   PLT 326 09/02/2020    Lab Results  Component Value Date   CREATININE 1.25 09/02/2020    Lab Results  Component Value Date   PSA 0.38 09/02/2020   PSA 0.3 08/15/2019   PSA 0.32 07/12/2013    Lab Results  Component Value Date   TESTOSTERONE 172 (L) 02/21/2014    Lab Results  Component Value Date   HGBA1C 4.7 02/07/2020    Urinalysis    Component Value Date/Time   COLORURINE  YELLOW 08/15/2019 1119   APPEARANCEUR Clear 04/17/2022 1053   LABSPEC 1.025 08/15/2019 1119   PHURINE 5.5 08/15/2019 1119   GLUCOSEU Negative 04/17/2022 1053   HGBUR NEGATIVE 08/15/2019 1119   BILIRUBINUR Negative 04/17/2022 1053   KETONESUR NEGATIVE 08/15/2019 1119   PROTEINUR Negative 04/17/2022 1053   PROTEINUR NEGATIVE 08/15/2019 1119   UROBILINOGEN 0.2 07/12/2013 1542   NITRITE Negative 04/17/2022 1053   NITRITE NEGATIVE 08/15/2019 1119   LEUKOCYTESUR Negative 04/17/2022 1053   LEUKOCYTESUR NEGATIVE 08/15/2019 1119    Lab Results  Component Value Date   LABMICR Comment 04/17/2022   BACTERIA NONE SEEN 06/24/2017    Pertinent Imaging: *** No results found for this or any previous visit.  No results found for this or any previous visit.  No results found for this or any previous visit.  No results found for this or any previous visit.  No results found for this or any previous visit.  No valid procedures specified. No results found for this or any previous visit.  No results found for this or any previous visit.   Assessment & Plan:    1. Benign prostatic hyperplasia, unspecified whether lower urinary tract symptoms present Increase flomax to BID - Urinalysis, Routine w reflex microscopic  2. Nocturia -Flomax BID   No follow-ups on file.  Wilkie Aye, MD  [facility] [place]

## 2023-03-23 ENCOUNTER — Encounter: Payer: Self-pay | Admitting: Urology

## 2023-03-23 LAB — PSA, TOTAL AND FREE
PSA, Free Pct: 34 %
PSA, Free: 0.17 ng/mL
Prostate Specific Ag, Serum: 0.5 ng/mL (ref 0.0–4.0)

## 2023-03-23 NOTE — Patient Instructions (Signed)

## 2023-05-07 IMAGING — US US ABDOMEN COMPLETE
1 series · 14 of 25 positions shown · non-contrast
Comparison: None

CLINICAL DATA: Left upper quadrant abdominal pain following
allergic reaction.

EXAM:
ABDOMEN ULTRASOUND COMPLETE

[Series 1: us abdomen complete · 14 of 98 slices shown]
[im 1/98]
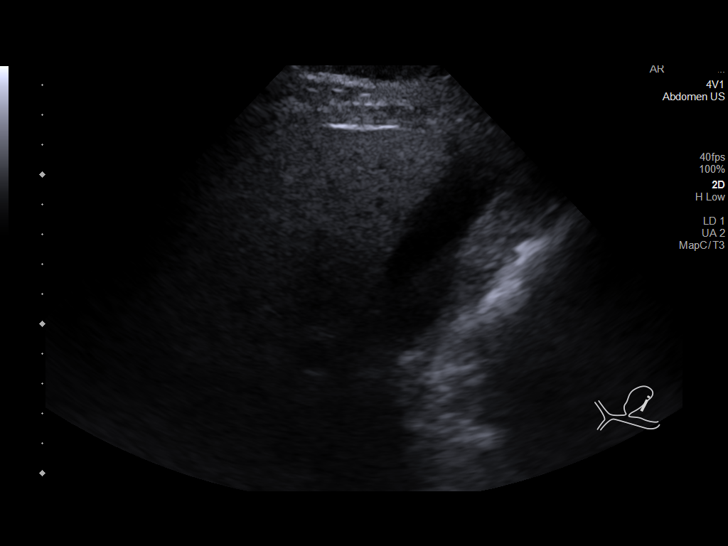
[im 9/98]
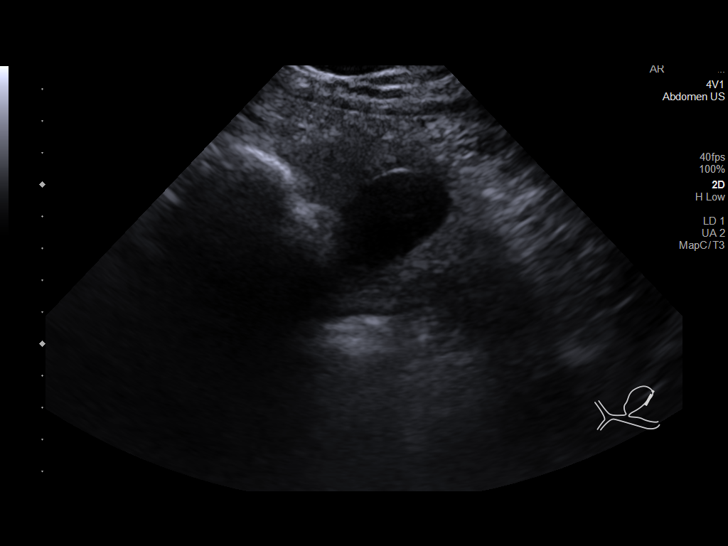
[im 17/98]
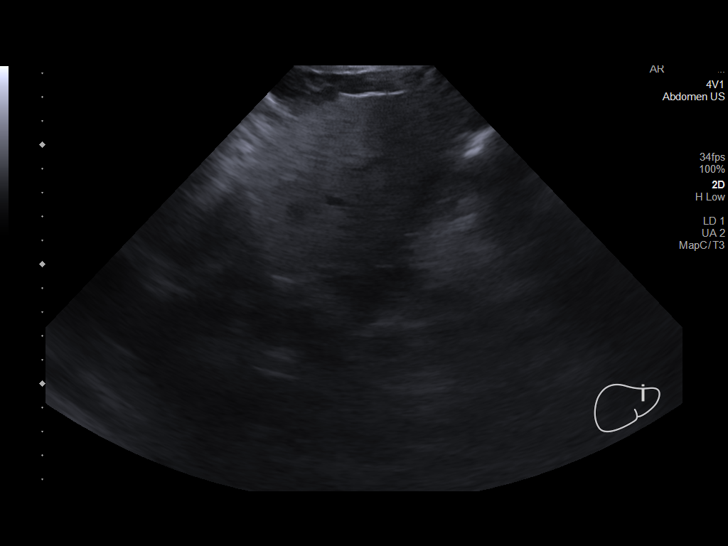
[im 25/98]
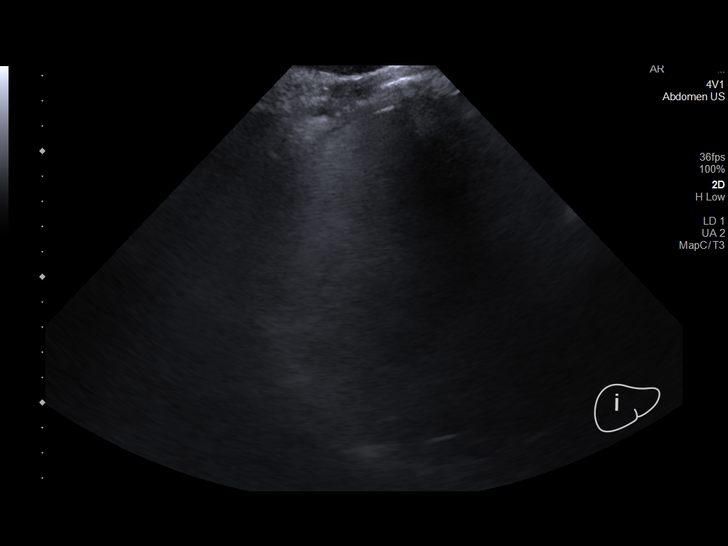
[im 33/98]
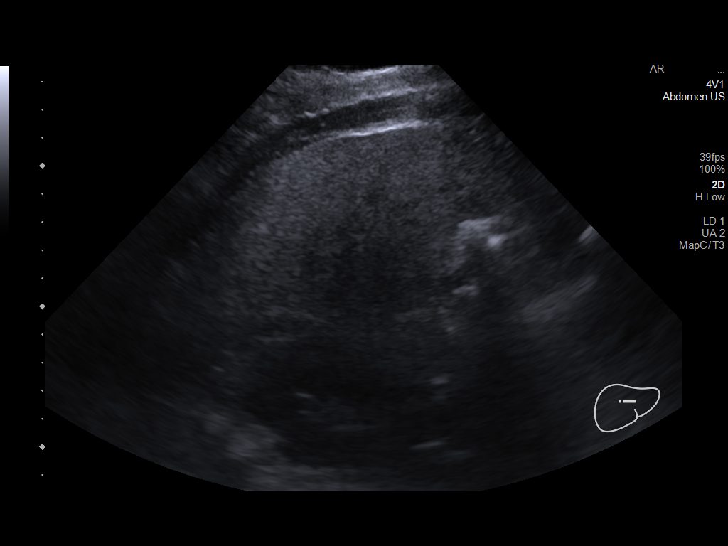
[im 37/98]
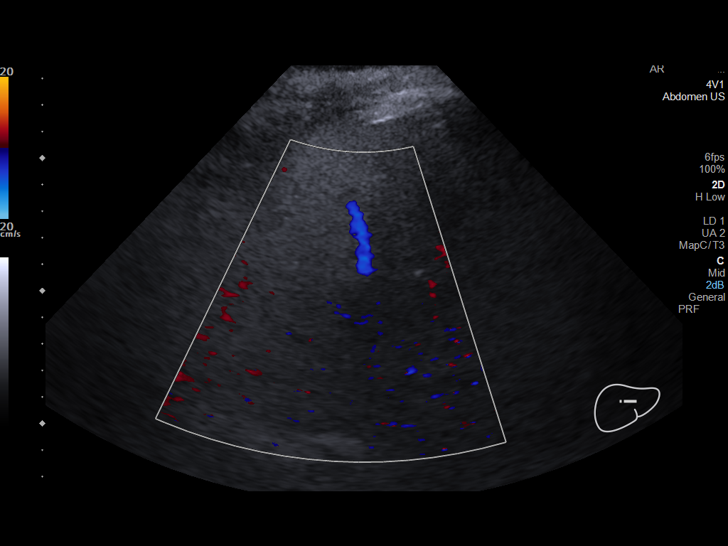
[im 45/98]
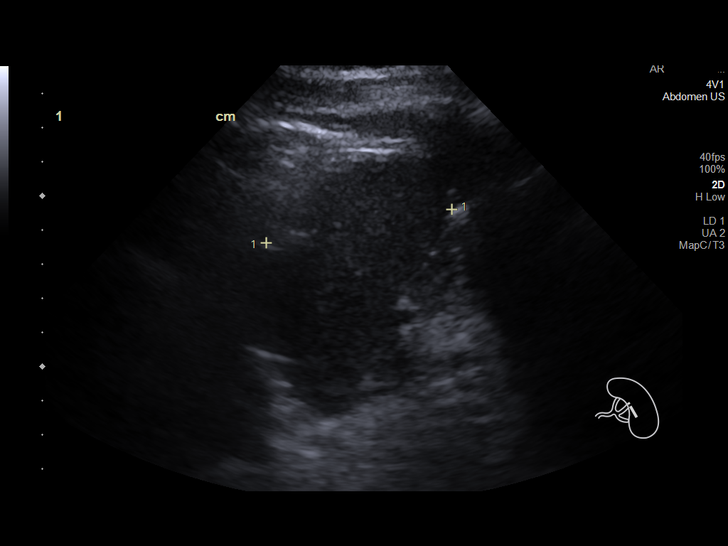
[im 53/98]
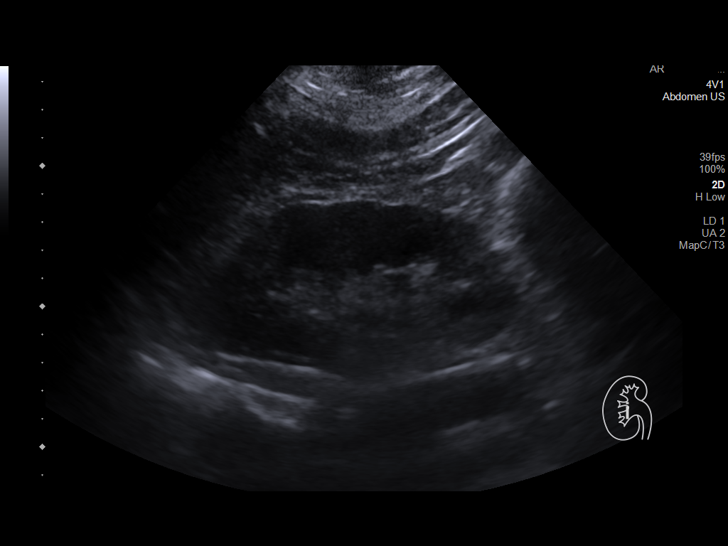
[im 61/98]
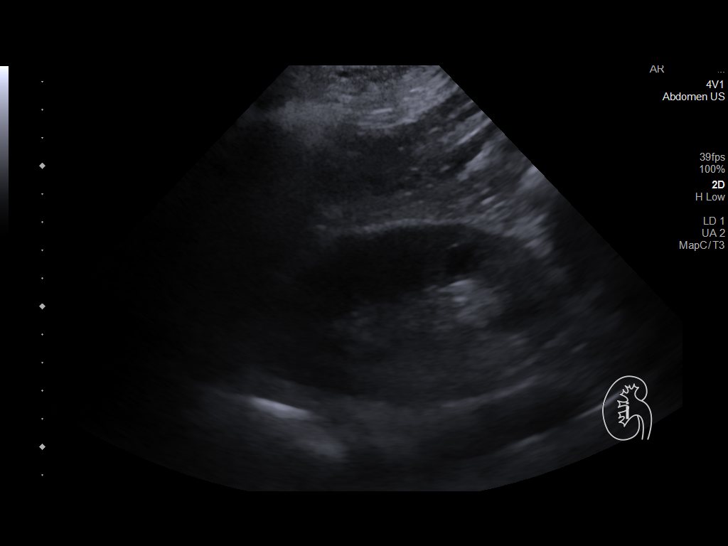
[im 65/98]
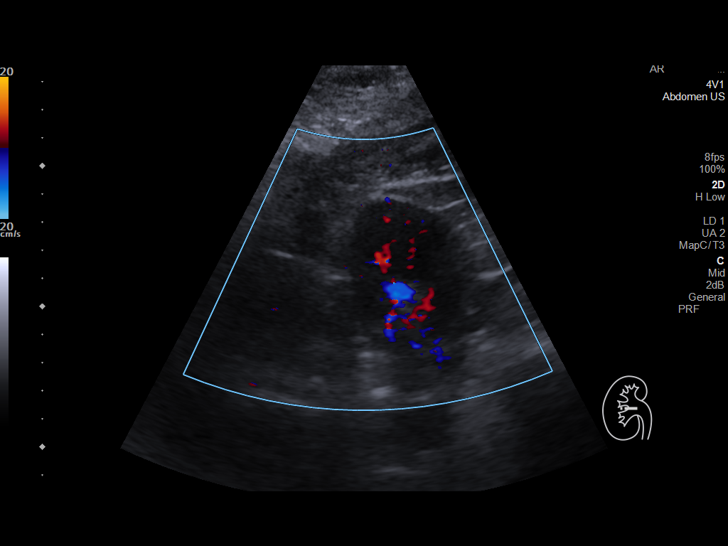
[im 73/98]
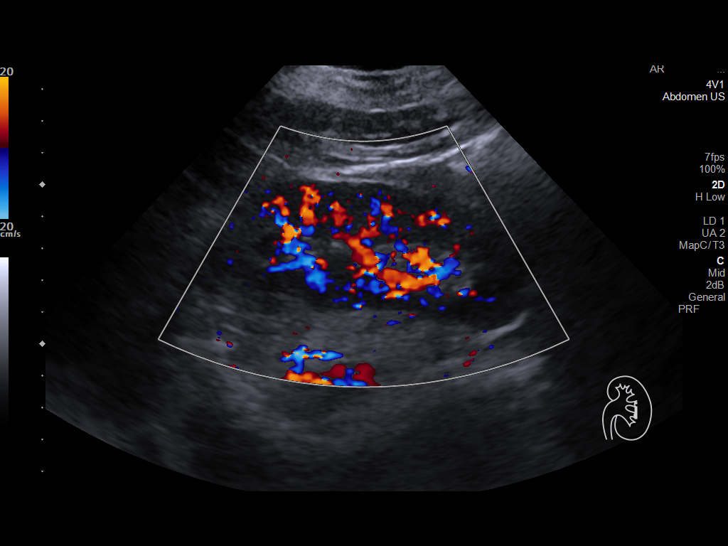
[im 81/98]
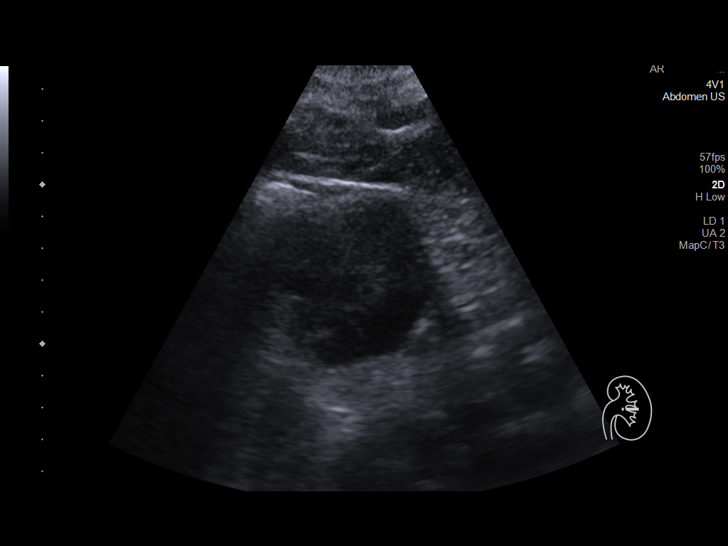
[im 89/98]
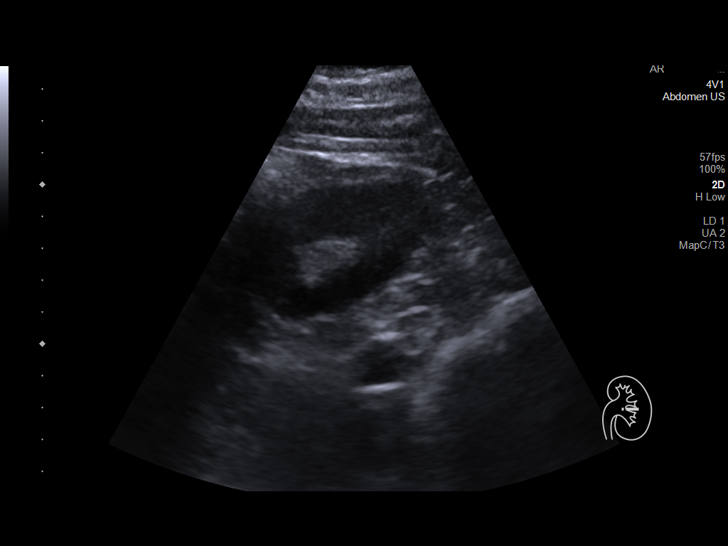
[im 98/98]
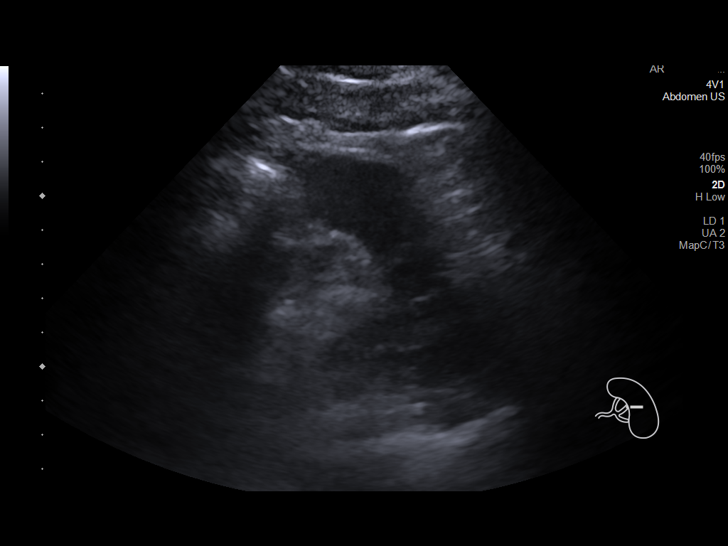

[14 of 25 positions shown; findings below may reference images not displayed]

FINDINGS: Gallbladder: Some sludge in the gallbladder. No shadowing stones. No
wall thickening or surrounding fluid. No Murphy sign.

Common bile duct: Diameter: 4 mm common normal.

Liver: Slightly echogenic. Mild lobular margins. Question cirrhosis
or steatosis. No focal lesion. Portal vein is patent on color
Doppler imaging with normal direction of blood flow towards the
liver.

IVC: No abnormality visualized.

Pancreas: Poorly seen because of overlying gas.

Spleen: Size and appearance within normal limits.

Right Kidney: Length: 11.6 cm. Echogenicity within normal limits. No
mass or hydronephrosis visualized.

Left Kidney: Length: 10.5 cm. Echogenicity within normal limits. No
mass or hydronephrosis visualized.

Abdominal aorta: No aneurysm visualized.

Other findings: No ascites.
IMPRESSION: No acute sonographic finding. Echogenic liver with lobular margins.
Question cirrhosis/steatosis. Gallbladder sludge but no evidence of
shadowing stone or cholecystitis by ultrasound.

## 2023-08-11 DIAGNOSIS — E782 Mixed hyperlipidemia: Secondary | ICD-10-CM | POA: Diagnosis not present

## 2023-08-11 DIAGNOSIS — Z713 Dietary counseling and surveillance: Secondary | ICD-10-CM | POA: Diagnosis not present

## 2023-08-11 DIAGNOSIS — I129 Hypertensive chronic kidney disease with stage 1 through stage 4 chronic kidney disease, or unspecified chronic kidney disease: Secondary | ICD-10-CM | POA: Diagnosis not present

## 2023-08-11 DIAGNOSIS — Z87891 Personal history of nicotine dependence: Secondary | ICD-10-CM | POA: Diagnosis not present

## 2023-08-11 DIAGNOSIS — N400 Enlarged prostate without lower urinary tract symptoms: Secondary | ICD-10-CM | POA: Diagnosis not present

## 2023-08-11 DIAGNOSIS — E611 Iron deficiency: Secondary | ICD-10-CM | POA: Diagnosis not present

## 2023-08-11 DIAGNOSIS — E669 Obesity, unspecified: Secondary | ICD-10-CM | POA: Diagnosis not present

## 2023-08-11 DIAGNOSIS — Z7182 Exercise counseling: Secondary | ICD-10-CM | POA: Diagnosis not present

## 2023-08-11 DIAGNOSIS — N1831 Chronic kidney disease, stage 3a: Secondary | ICD-10-CM | POA: Diagnosis not present

## 2023-08-11 DIAGNOSIS — I10 Essential (primary) hypertension: Secondary | ICD-10-CM | POA: Diagnosis not present

## 2023-08-11 DIAGNOSIS — Z6838 Body mass index (BMI) 38.0-38.9, adult: Secondary | ICD-10-CM | POA: Diagnosis not present

## 2023-08-11 DIAGNOSIS — H259 Unspecified age-related cataract: Secondary | ICD-10-CM | POA: Diagnosis not present

## 2023-09-22 DIAGNOSIS — Z87891 Personal history of nicotine dependence: Secondary | ICD-10-CM | POA: Diagnosis not present

## 2023-09-22 DIAGNOSIS — Z8249 Family history of ischemic heart disease and other diseases of the circulatory system: Secondary | ICD-10-CM | POA: Diagnosis not present

## 2023-09-22 DIAGNOSIS — N400 Enlarged prostate without lower urinary tract symptoms: Secondary | ICD-10-CM | POA: Diagnosis not present

## 2023-09-22 DIAGNOSIS — Z7982 Long term (current) use of aspirin: Secondary | ICD-10-CM | POA: Diagnosis not present

## 2023-09-22 DIAGNOSIS — N1831 Chronic kidney disease, stage 3a: Secondary | ICD-10-CM | POA: Diagnosis not present

## 2023-09-22 DIAGNOSIS — Z888 Allergy status to other drugs, medicaments and biological substances status: Secondary | ICD-10-CM | POA: Diagnosis not present

## 2023-09-22 DIAGNOSIS — M109 Gout, unspecified: Secondary | ICD-10-CM | POA: Diagnosis not present

## 2023-09-22 DIAGNOSIS — M1990 Unspecified osteoarthritis, unspecified site: Secondary | ICD-10-CM | POA: Diagnosis not present

## 2023-09-22 DIAGNOSIS — Z791 Long term (current) use of non-steroidal anti-inflammatories (NSAID): Secondary | ICD-10-CM | POA: Diagnosis not present

## 2023-09-22 DIAGNOSIS — L309 Dermatitis, unspecified: Secondary | ICD-10-CM | POA: Diagnosis not present

## 2024-02-03 DIAGNOSIS — Z125 Encounter for screening for malignant neoplasm of prostate: Secondary | ICD-10-CM | POA: Diagnosis not present

## 2024-02-03 DIAGNOSIS — E611 Iron deficiency: Secondary | ICD-10-CM | POA: Diagnosis not present

## 2024-02-03 DIAGNOSIS — E782 Mixed hyperlipidemia: Secondary | ICD-10-CM | POA: Diagnosis not present

## 2024-02-09 DIAGNOSIS — N400 Enlarged prostate without lower urinary tract symptoms: Secondary | ICD-10-CM | POA: Diagnosis not present

## 2024-02-09 DIAGNOSIS — I129 Hypertensive chronic kidney disease with stage 1 through stage 4 chronic kidney disease, or unspecified chronic kidney disease: Secondary | ICD-10-CM | POA: Diagnosis not present

## 2024-02-09 DIAGNOSIS — H259 Unspecified age-related cataract: Secondary | ICD-10-CM | POA: Diagnosis not present

## 2024-02-09 DIAGNOSIS — N1831 Chronic kidney disease, stage 3a: Secondary | ICD-10-CM | POA: Diagnosis not present

## 2024-02-09 DIAGNOSIS — E669 Obesity, unspecified: Secondary | ICD-10-CM | POA: Diagnosis not present

## 2024-02-09 DIAGNOSIS — L209 Atopic dermatitis, unspecified: Secondary | ICD-10-CM | POA: Diagnosis not present

## 2024-02-09 DIAGNOSIS — E611 Iron deficiency: Secondary | ICD-10-CM | POA: Diagnosis not present

## 2024-02-09 DIAGNOSIS — I10 Essential (primary) hypertension: Secondary | ICD-10-CM | POA: Diagnosis not present

## 2024-02-09 DIAGNOSIS — R7301 Impaired fasting glucose: Secondary | ICD-10-CM | POA: Diagnosis not present

## 2024-02-09 DIAGNOSIS — Z713 Dietary counseling and surveillance: Secondary | ICD-10-CM | POA: Diagnosis not present

## 2024-02-09 DIAGNOSIS — E782 Mixed hyperlipidemia: Secondary | ICD-10-CM | POA: Diagnosis not present

## 2024-02-09 DIAGNOSIS — Z6836 Body mass index (BMI) 36.0-36.9, adult: Secondary | ICD-10-CM | POA: Diagnosis not present

## 2024-03-24 ENCOUNTER — Ambulatory Visit (INDEPENDENT_AMBULATORY_CARE_PROVIDER_SITE_OTHER): Payer: Managed Care, Other (non HMO) | Admitting: Urology

## 2024-03-24 ENCOUNTER — Encounter: Payer: Self-pay | Admitting: Urology

## 2024-03-24 VITALS — BP 148/71 | HR 61

## 2024-03-24 DIAGNOSIS — N529 Male erectile dysfunction, unspecified: Secondary | ICD-10-CM | POA: Diagnosis not present

## 2024-03-24 DIAGNOSIS — N400 Enlarged prostate without lower urinary tract symptoms: Secondary | ICD-10-CM | POA: Diagnosis not present

## 2024-03-24 DIAGNOSIS — R351 Nocturia: Secondary | ICD-10-CM

## 2024-03-24 LAB — BLADDER SCAN AMB NON-IMAGING: Scan Result: 28

## 2024-03-24 LAB — URINALYSIS, ROUTINE W REFLEX MICROSCOPIC
Bilirubin, UA: NEGATIVE
Glucose, UA: NEGATIVE
Ketones, UA: NEGATIVE
Leukocytes,UA: NEGATIVE
Nitrite, UA: NEGATIVE
Protein,UA: NEGATIVE
RBC, UA: NEGATIVE
Specific Gravity, UA: 1.02 (ref 1.005–1.030)
Urobilinogen, Ur: 0.2 mg/dL (ref 0.2–1.0)
pH, UA: 6 (ref 5.0–7.5)

## 2024-03-24 MED ORDER — TAMSULOSIN HCL 0.4 MG PO CAPS: 0.4000 mg | ORAL_CAPSULE | Freq: Two times a day (BID) | ORAL | 11 refills | Status: AC

## 2024-03-24 MED ORDER — VARDENAFIL HCL 20 MG PO TABS: 20.0000 mg | ORAL_TABLET | Freq: Every day | ORAL | 0 refills | Status: DC | PRN

## 2024-03-24 NOTE — Patient Instructions (Signed)

## 2024-03-24 NOTE — Progress Notes (Signed)
post void residual=28 

## 2024-03-24 NOTE — Progress Notes (Signed)
 03/24/2024 10:59 AM   Larry Fleming [birth year] [id number]  Referring provider: Newt Barefoot, FNP [address] [place],  [place] [address]  Followup BPH   HPI: Mr Larry Fleming is a 68yo here for followup for BPH with nocturia.IPSS 13 QOL 3 on flomax  0.4mg  BID. Urine stream strong. No straining to urinate. Nocturia 2-3x depending on fluid consumption. No hematuria or dysuria.  He continues to have issues getting and maintaining an erection. He has tried viagra  and cialis previously   PMH: Past Medical History:  Diagnosis Date   BPH (benign prostatic hypertrophy)    Gout    Hyperlipidemia    Medical history non-contributory     Surgical History: Past Surgical History:  Procedure Laterality Date   CIRCUMCISION  30 years ago   COLONOSCOPY N/A 08/11/2016   Procedure: COLONOSCOPY;  Surgeon: Suzette Espy, MD;  [facility];  Service: Endoscopy;  Laterality: N/A;  1:15 PM    Home Medications:  Allergies as of 03/24/2024       Reactions   Lipitor [atorvastatin ]    Myalgias    Uroxatral  [alfuzosin ] Hives        Medication List        Accurate as of March 24, 2024 10:59 AM. If you have any questions, ask your nurse or doctor.          allopurinol  100 MG tablet Commonly known as: ZYLOPRIM  TAKE 2 TABLETS(200 MG) BY MOUTH DAILY   allopurinol  300 MG tablet Commonly known as: ZYLOPRIM  Take 300 mg by mouth daily.   aspirin EC 81 MG tablet Take 81 mg by mouth daily.   cetirizine 10 MG tablet Commonly known as: ZYRTEC Take 10 mg by mouth daily.   naproxen sodium 220 MG tablet Commonly known as: ALEVE Take 220 mg by mouth 2 (two) times daily with a meal.   nystatin -triamcinolone  ointment Commonly known as: MYCOLOG Apply 1 application. topically 2 (two) times daily.   Omega 3 1000 MG Caps Take 1 capsule (1,000 mg total) by mouth 2 (two) times daily.   pravastatin  20 MG tablet Commonly known as: PRAVACHOL  Take 1 tablet three times a week    tamsulosin  0.4 MG Caps capsule Commonly known as: FLOMAX  Take 1 capsule (0.4 mg total) by mouth 2 (two) times daily.        Allergies:  Allergies  Allergen Reactions   Lipitor [Atorvastatin ]     Myalgias    Uroxatral  [Alfuzosin ] Hives    Family History: Family History  Problem Relation Age of Onset   Diabetes Brother    Stroke Brother    Cancer Brother        prostate   Kidney disease Sister     Social History:  reports that he quit smoking about 26 years ago. His smoking use included cigarettes. He started smoking about 30 years ago. He has a 4 pack-year smoking history. He has never used smokeless tobacco. He reports that he does not drink alcohol and does not use drugs.  ROS: All other review of systems were reviewed and are negative except what is noted above in HPI  Physical Exam: BP (!) 148/71   Pulse 61   Constitutional:  Alert and oriented, No acute distress. HEENT: [place] AT, moist mucus membranes.  Trachea midline, no masses. Cardiovascular: No clubbing, cyanosis, or edema. Respiratory: Normal respiratory effort, no increased work of breathing. GI: Abdomen is soft, nontender, nondistended, no abdominal masses GU: No CVA tenderness.  Lymph: No  cervical or inguinal lymphadenopathy. Skin: No rashes, bruises or suspicious lesions. Neurologic: Grossly intact, no focal deficits, moving all 4 extremities. Psychiatric: Normal mood and affect.  Laboratory Data: Lab Results  Component Value Date   WBC 7.2 09/02/2020   HGB 15.7 09/02/2020   HCT 48.6 09/02/2020   MCV 81.4 09/02/2020   PLT 326 09/02/2020    Lab Results  Component Value Date   CREATININE 1.25 09/02/2020    Lab Results  Component Value Date   PSA 0.38 09/02/2020   PSA 0.3 08/15/2019   PSA 0.32 07/12/2013    Lab Results  Component Value Date   TESTOSTERONE  172 (L) 02/21/2014    Lab Results  Component Value Date   HGBA1C 4.7 02/07/2020    Urinalysis    Component Value Date/Time    COLORURINE YELLOW 08/15/2019 1119   APPEARANCEUR Clear 03/22/2023 1125   LABSPEC 1.025 08/15/2019 1119   PHURINE 5.5 08/15/2019 1119   GLUCOSEU Negative 03/22/2023 1125   HGBUR NEGATIVE 08/15/2019 1119   BILIRUBINUR Negative 03/22/2023 1125   KETONESUR NEGATIVE 08/15/2019 1119   PROTEINUR Negative 03/22/2023 1125   PROTEINUR NEGATIVE 08/15/2019 1119   UROBILINOGEN 0.2 07/12/2013 1542   NITRITE Negative 03/22/2023 1125   NITRITE NEGATIVE 08/15/2019 1119   LEUKOCYTESUR Negative 03/22/2023 1125   LEUKOCYTESUR NEGATIVE 08/15/2019 1119    Lab Results  Component Value Date   LABMICR Comment 03/22/2023   BACTERIA NONE SEEN 06/24/2017    Pertinent Imaging:  No results found for this or any previous visit.  No results found for this or any previous visit.  No results found for this or any previous visit.  No results found for this or any previous visit.  No results found for this or any previous visit.  No results found for this or any previous visit.  No results found for this or any previous visit.  No results found for this or any previous visit.   Assessment & Plan:    1. Benign prostatic hyperplasia, unspecified whether lower urinary tract symptoms present (Primary) -continue flomax  0.4mg  BID - Urinalysis, Routine w reflex microscopic - BLADDER SCAN AMB NON-IMAGING  2. Nocturia Continue flomax  0.4mg  BID  3. Erectile dysfunction -we will trial vardenafil 20mg  prn   No follow-ups on file.  [name], MD  [facility] [place]

## 2024-05-19 ENCOUNTER — Other Ambulatory Visit: Payer: Self-pay

## 2024-05-19 NOTE — Telephone Encounter (Signed)
 Patient is requesting medication refill. Patient last seen 03/24/2024 and has next office visited 03/28/2025.

## 2024-05-24 MED ORDER — VARDENAFIL HCL 20 MG PO TABS: 20.0000 mg | ORAL_TABLET | Freq: Every day | ORAL | 0 refills | Status: DC | PRN

## 2024-05-25 NOTE — Telephone Encounter (Signed)
 Patient is made aware Vardenafil  has been approved and sent to pharmacy on file.

## 2024-07-03 ENCOUNTER — Other Ambulatory Visit: Payer: Self-pay

## 2024-07-04 MED ORDER — VARDENAFIL HCL 20 MG PO TABS: 20.0000 mg | ORAL_TABLET | Freq: Every day | ORAL | 0 refills | Status: DC | PRN

## 2024-08-04 DIAGNOSIS — I10 Essential (primary) hypertension: Secondary | ICD-10-CM | POA: Diagnosis not present

## 2024-08-04 DIAGNOSIS — R7301 Impaired fasting glucose: Secondary | ICD-10-CM | POA: Diagnosis not present

## 2024-08-10 DIAGNOSIS — Z6832 Body mass index (BMI) 32.0-32.9, adult: Secondary | ICD-10-CM | POA: Diagnosis not present

## 2024-08-10 DIAGNOSIS — E669 Obesity, unspecified: Secondary | ICD-10-CM | POA: Diagnosis not present

## 2024-08-10 DIAGNOSIS — R7301 Impaired fasting glucose: Secondary | ICD-10-CM | POA: Diagnosis not present

## 2024-08-10 DIAGNOSIS — I129 Hypertensive chronic kidney disease with stage 1 through stage 4 chronic kidney disease, or unspecified chronic kidney disease: Secondary | ICD-10-CM | POA: Diagnosis not present

## 2024-08-10 DIAGNOSIS — N1831 Chronic kidney disease, stage 3a: Secondary | ICD-10-CM | POA: Diagnosis not present

## 2024-08-10 DIAGNOSIS — E782 Mixed hyperlipidemia: Secondary | ICD-10-CM | POA: Diagnosis not present

## 2024-08-10 DIAGNOSIS — I10 Essential (primary) hypertension: Secondary | ICD-10-CM | POA: Diagnosis not present

## 2024-08-10 DIAGNOSIS — N400 Enlarged prostate without lower urinary tract symptoms: Secondary | ICD-10-CM | POA: Diagnosis not present

## 2024-08-10 DIAGNOSIS — E611 Iron deficiency: Secondary | ICD-10-CM | POA: Diagnosis not present

## 2024-08-10 DIAGNOSIS — L209 Atopic dermatitis, unspecified: Secondary | ICD-10-CM | POA: Diagnosis not present

## 2024-08-10 DIAGNOSIS — Z23 Encounter for immunization: Secondary | ICD-10-CM | POA: Diagnosis not present

## 2024-08-10 DIAGNOSIS — Z0000 Encounter for general adult medical examination without abnormal findings: Secondary | ICD-10-CM | POA: Diagnosis not present

## 2024-08-15 ENCOUNTER — Other Ambulatory Visit: Payer: Self-pay | Admitting: Urology

## 2024-08-15 DIAGNOSIS — H524 Presbyopia: Secondary | ICD-10-CM | POA: Diagnosis not present

## 2024-08-15 DIAGNOSIS — H52223 Regular astigmatism, bilateral: Secondary | ICD-10-CM | POA: Diagnosis not present

## 2024-08-15 DIAGNOSIS — H5212 Myopia, left eye: Secondary | ICD-10-CM | POA: Diagnosis not present

## 2024-08-15 DIAGNOSIS — H2512 Age-related nuclear cataract, left eye: Secondary | ICD-10-CM | POA: Diagnosis not present

## 2024-09-25 DIAGNOSIS — H02831 Dermatochalasis of right upper eyelid: Secondary | ICD-10-CM | POA: Diagnosis not present

## 2024-09-25 DIAGNOSIS — H40013 Open angle with borderline findings, low risk, bilateral: Secondary | ICD-10-CM | POA: Diagnosis not present

## 2024-09-25 DIAGNOSIS — H25812 Combined forms of age-related cataract, left eye: Secondary | ICD-10-CM | POA: Diagnosis not present

## 2024-09-25 DIAGNOSIS — H02834 Dermatochalasis of left upper eyelid: Secondary | ICD-10-CM | POA: Diagnosis not present

## 2024-10-23 NOTE — H&P (Signed)
 Surgical History & Physical  Patient Name: Larry Fleming  DOB: Jan 30, 1956  Surgery: Cataract extraction with intraocular lens implant phacoemulsification; Left Eye Surgeon: Lynwood Hermann MD Surgery Date: 10/27/2024 Pre-Op Date: 09/25/2024  HPI: A 19 Yr. old male patient 1.  The patient is here for Cataract Eval OS. The patient complains of difficulty when reading fine print, books, newspaper, instructions etc., which began 1 year ago. The left eye is affected. The episode is gradual. The patient describes foggy and hazy symptoms affecting their eyes/vision. This is negatively affecting the patient's quality of life and the patient is unable to function adequately in life with the current level of vision. HPI Completed by Dr. Lynwood Hermann  Medical History: Cataracts  Review of Systems Negative Allergic/Immunologic Negative Cardiovascular Negative Constitutional Negative Ear, Nose, Mouth & Throat Negative Endocrine Negative Eyes Negative Gastrointestinal Negative Genitourinary Negative Hemotologic/Lymphatic Negative Integumentary Negative Musculoskeletal Negative Neurological Negative Psychiatry Negative Respiratory  Social Never smoked  Medication Prednisolone-moxiflox-bromfen,  Tamsulosin , Allopurinol , Vardenafil   Sx/Procedures Phaco c IOL OD  Drug Allergies  NKDA  History & Physical: Heent: cataracts NECK: supple without bruits LUNGS: lungs clear to auscultation CV: regular rate and rhythm Abdomen: soft and non-tender  Impression & Plan: Assessment: 1.  COMBINED FORMS AGE RELATED CATARACT; Left Eye (H25.812) 2.  INTRAOCULAR LENS IOL (Z96.1) 3.  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  DERMATOCHALASIS, no surgery; Right Upper Lid, Left Upper Lid (H02.831, H02.834) 5.  BLEPHARITIS; Right Upper Lid, Right Lower Lid, Left Upper Lid, Left Lower Lid (H01.001, H01.002,H01.004,H01.005) 6.  Pinguecula; Both Eyes (H11.153) 7.  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  Cataract accounts for the patient's decreased vision. This visual impairment is not correctable with a tolerable change in glasses or contact lenses. Cataract surgery with an implantation of a new lens should significantly improve the visual and functional status of the patient. Recommend phacoemulsification with intraocular lens. Discussed all risks, benefits, alternatives, and potential complications. Discussed the procedures and recovery. The patient desires to have surgery. A-scan/Biometry ordered and will be performed for intraocular lens calculations. The surgery will be performed in order to improve vision for driving, reading, and for eye examinations. Recommend Dextenza for post-operative pain and inflammation. Educational materials provided: Cataract. History of corneal refractive Surgery: None History of Previous Ocular Surgery (PPV, other): PCIOL OD History of ocular trauma: None Use of Eye Pressure Lowering Drops: None No current contact lens use. Pupil Status: Dilates well - shugarcaine or Lidocaine +Omidira by protocol Left Eye only.  2.  Performed by Dr. Cecilia in [place]. Will call that practice to get records as to which IOL was placed to attempt to balance for OS.  3.  Based on cup-to-disc ratio. Negative Family history. OCT rNFL shows: Borderline OS. IOP WNL. WIll perform additional testing after cataract surgery. Detailed discussion about glaucoma today including importance of maintaining good follow up and following treatment plan, and the possibility of irreversible blindness as part of this disease process.  4.  Asymptomatic, recommend observation for now. Findings, prognosis and treatment options reviewed.  5.  Blepharitis is present - recommend regular lid cleaning.  6.  Observe; Artificial tears as needed for irritation.  7.  Preservative Free Artificial tears 1 drop 2-3x/day.

## 2024-10-24 ENCOUNTER — Encounter: Payer: Self-pay | Admitting: *Deleted

## 2024-10-24 NOTE — Progress Notes (Signed)
 Larry Fleming                                          MRN: [id number]   10/24/2024   The [facility]I Quality Team Specialist reviewed this patient medical record for the purposes of chart review for care gap closure. The following were reviewed: chart review for care gap closure-controlling blood pressure.    VBCI Quality Team

## 2024-10-25 ENCOUNTER — Encounter (HOSPITAL_COMMUNITY)
Admission: RE | Admit: 2024-10-25 | Discharge: 2024-10-25 | Disposition: A | Discharge status: Home / Self Care | Source: Ambulatory Visit | Attending: Ophthalmology | Admitting: Ophthalmology

## 2024-10-25 ENCOUNTER — Other Ambulatory Visit: Payer: Self-pay

## 2024-10-25 ENCOUNTER — Encounter (HOSPITAL_COMMUNITY): Payer: Self-pay

## 2024-10-27 ENCOUNTER — Ambulatory Visit (HOSPITAL_COMMUNITY)
Admission: RE | Admit: 2024-10-27 | Discharge: 2024-10-27 | Disposition: A | Discharge status: Home / Self Care | Attending: Ophthalmology | Admitting: Ophthalmology

## 2024-10-27 ENCOUNTER — Ambulatory Visit (HOSPITAL_COMMUNITY): Admitting: Anesthesiology

## 2024-10-27 ENCOUNTER — Encounter (HOSPITAL_COMMUNITY): Admission: RE | Disposition: A | Payer: Self-pay | Source: Home / Self Care | Attending: Ophthalmology

## 2024-10-27 DIAGNOSIS — Z87891 Personal history of nicotine dependence: Secondary | ICD-10-CM | POA: Insufficient documentation

## 2024-10-27 DIAGNOSIS — I10 Essential (primary) hypertension: Secondary | ICD-10-CM | POA: Insufficient documentation

## 2024-10-27 DIAGNOSIS — H25812 Combined forms of age-related cataract, left eye: Secondary | ICD-10-CM | POA: Insufficient documentation

## 2024-10-27 MED ORDER — SODIUM HYALURONATE 10 MG/ML IO SOLUTION
PREFILLED_SYRINGE | INTRAOCULAR | Status: DC | PRN
  Administered 2024-10-27: .85 mL via INTRAOCULAR

## 2024-10-27 MED ORDER — STERILE WATER FOR IRRIGATION IR SOLN
Status: DC | PRN
  Administered 2024-10-27: 1

## 2024-10-27 MED ORDER — POVIDONE-IODINE 5 % OP SOLN
OPHTHALMIC | Status: DC | PRN
  Administered 2024-10-27: 1 via OPHTHALMIC

## 2024-10-27 MED ORDER — MOXIFLOXACIN HCL 5 MG/ML IO SOLN
INTRAOCULAR | Status: DC | PRN
  Administered 2024-10-27: .2 mL via INTRACAMERAL

## 2024-10-27 MED ORDER — PHENYLEPHRINE-KETOROLAC 1-0.3 % IO SOLN
INTRAOCULAR | Status: DC | PRN
  Administered 2024-10-27: 500 mL via OPHTHALMIC

## 2024-10-27 MED ORDER — LACTATED RINGERS IV SOLN: INTRAVENOUS | Status: DC

## 2024-10-27 MED ORDER — TROPICAMIDE 1 % OP SOLN
1.0000 [drp] | OPHTHALMIC | Status: AC | PRN
  Administered 2024-10-27 (×3): 1 [drp] via OPHTHALMIC

## 2024-10-27 MED ORDER — SODIUM CHLORIDE 0.9% FLUSH
INTRAVENOUS | Status: DC | PRN
  Administered 2024-10-27: 3 mL via INTRAVENOUS

## 2024-10-27 MED ORDER — SODIUM HYALURONATE 23MG/ML IO SOSY
PREFILLED_SYRINGE | INTRAOCULAR | Status: DC | PRN
  Administered 2024-10-27: .6 mL via INTRAOCULAR

## 2024-10-27 MED ORDER — MIDAZOLAM HCL (PF) 2 MG/2ML IJ SOLN
INTRAMUSCULAR | Status: DC | PRN
  Administered 2024-10-27: 1 mg via INTRAVENOUS

## 2024-10-27 MED ORDER — LIDOCAINE HCL (PF) 1 % IJ SOLN
INTRAMUSCULAR | Status: DC | PRN
  Administered 2024-10-27: 1 mL

## 2024-10-27 MED ORDER — MIDAZOLAM HCL 2 MG/2ML IJ SOLN
INTRAMUSCULAR | Status: AC
  Filled 2024-10-27: qty 2

## 2024-10-27 MED ORDER — TRYPAN BLUE 0.06 % IO SOSY
PREFILLED_SYRINGE | INTRAOCULAR | Status: DC | PRN
  Administered 2024-10-27: .5 mL via INTRAOCULAR

## 2024-10-27 MED ORDER — PHENYLEPHRINE HCL 2.5 % OP SOLN
1.0000 [drp] | OPHTHALMIC | Status: AC | PRN
  Administered 2024-10-27 (×3): 1 [drp] via OPHTHALMIC

## 2024-10-27 MED ORDER — TETRACAINE HCL 0.5 % OP SOLN
1.0000 [drp] | OPHTHALMIC | Status: AC | PRN
  Administered 2024-10-27 (×3): 1 [drp] via OPHTHALMIC

## 2024-10-27 MED ORDER — LIDOCAINE HCL 3.5 % OP GEL
1.0000 | Freq: Once | OPHTHALMIC | Status: AC
  Administered 2024-10-27: 1 via OPHTHALMIC

## 2024-10-27 MED ORDER — BSS IO SOLN
INTRAOCULAR | Status: DC | PRN
  Administered 2024-10-27: 15 mL via INTRAOCULAR

## 2024-10-27 NOTE — Op Note (Signed)
 Date of procedure: 10/27/24  Pre-operative diagnosis: Visually significant age-related combined cataract, Left Eye (H25.812)  Post-operative diagnosis: Visually significant age-related combined cataract, Left Eye (H25.812)  Procedure: Removal of cataract via phacoemulsification and insertion of intra-ocular lens Alcon CNA0T0 +17.0D into the capsular bag of the Left Eye  Attending surgeon: Lynwood LABOR. Alcus Bradly, MD, MA  Anesthesia: MAC, Topical Akten   Complications: None  Estimated Blood Loss: <51mL (minimal)  Specimens: None  Implants: As above  Indications:  Visually significant age-related cataract, Left Eye  Procedure:  The patient was seen and identified in the pre-operative area. The operative eye was identified and dilated.  The operative eye was marked.  Topical anesthesia was administered to the operative eye.     The patient was then to the operative suite and placed in the supine position.  A timeout was performed confirming the patient, procedure to be performed, and all other relevant information.   The patient's face was prepped and draped in the usual fashion for intra-ocular surgery.  A lid speculum was placed into the operative eye and the surgical microscope moved into place and focused.  An inferotemporal paracentesis was created using a 20 gauge paracentesis blade. Omidria  was injected into the anterior chamber. Shugarcaine was injected into the anterior chamber.  Viscoelastic was injected into the anterior chamber.  A temporal clear-corneal main wound incision was created using a 2.29mm microkeratome.  A continuous curvilinear capsulorrhexis was initiated using an irrigating cystitome and completed using capsulorrhexis forceps.  Hydrodissection and hydrodeliniation were performed.  Viscoelastic was injected into the anterior chamber.  A phacoemulsification handpiece and a chopper as a second instrument were used to remove the nucleus and epinucleus. The irrigation/aspiration  handpiece was used to remove any remaining cortical material.   The capsular bag was reinflated with viscoelastic, checked, and found to be intact.  The intraocular lens was inserted into the capsular bag.  The irrigation/aspiration handpiece was used to remove any remaining viscoelastic.  The clear corneal wound and paracentesis wounds were then hydrated and checked with Weck-Cels to be watertight. 0.1mL of Moxfloxacin was injected into the anterior chamber. The lid-speculum was removed.  The drape was removed.  The patient's face was cleaned with a wet and dry 4x4.    A clear shield was taped over the eye. The patient was taken to the post-operative care unit in good condition, having tolerated the procedure well.  Post-Op Instructions: The patient will follow up at [facility] for a same day post-operative evaluation and will receive all other orders and instructions.

## 2024-10-27 NOTE — Interval H&P Note (Signed)
 History and Physical Interval Note:  10/27/2024 12:00 PM  Larry Fleming  has presented today for surgery, with the diagnosis of combined forms age related cataract, left eye.  The various methods of treatment have been discussed with the patient and family. After consideration of risks, benefits and other options for treatment, the patient has consented to  Procedures with comments: PHACOEMULSIFICATION, CATARACT, WITH IOL INSERTION (Left) - CDE: as a surgical intervention.  The patient's history has been reviewed, patient examined, no change in status, stable for surgery.  I have reviewed the patient's chart and labs.  Questions were answered to the patient's satisfaction.     Larry Fleming

## 2024-10-27 NOTE — Anesthesia Procedure Notes (Signed)
 Date/Time: 10/27/2024 12:06 PM  Performed by: Barbarann Verneita RAMAN, CRNAPre-anesthesia Checklist: Patient identified, Emergency Drugs available, Suction available, Timeout performed and Patient being monitored Patient Re-evaluated:Patient Re-evaluated prior to induction Oxygen Delivery Method: Nasal Cannula

## 2024-10-27 NOTE — Anesthesia Postprocedure Evaluation (Signed)
"   Anesthesia Post Note  Patient: Larry Fleming  Procedure(s) Performed: PHACOEMULSIFICATION, CATARACT, WITH IOL INSERTION (Left: Eye)  Patient location during evaluation: Phase II Anesthesia Type: MAC Level of consciousness: awake Pain management: pain level controlled Vital Signs Assessment: post-procedure vital signs reviewed and stable Respiratory status: spontaneous breathing and respiratory function stable Cardiovascular status: blood pressure returned to baseline and stable Postop Assessment: no headache and no apparent nausea or vomiting Anesthetic complications: no Comments: Late entry   No notable events documented.   Last Vitals:  Vitals:   10/27/24 1129 10/27/24 1227  BP: (!) 167/68 (!) 166/72  Pulse: 60 63  Resp: 18 12  Temp: 36.5 C 36.5 C  SpO2: 98% 99%    Last Pain:  Vitals:   10/27/24 1227  TempSrc: Oral  PainSc: 0-No pain                 Yvonna PARAS Wrangler Penning      "

## 2024-10-27 NOTE — Transfer of Care (Signed)
 Immediate Anesthesia Transfer of Care Note  Patient: Larry Fleming  Procedure(s) Performed: PHACOEMULSIFICATION, CATARACT, WITH IOL INSERTION (Left: Eye)  Patient Location: Short Stay  Anesthesia Type:MAC  Level of Consciousness: awake and patient cooperative  Airway & Oxygen Therapy: Patient Spontanous Breathing  Post-op Assessment: Report given to RN and Post -op Vital signs reviewed and stable  Post vital signs: Reviewed and stable  Last Vitals:  Vitals Value Taken Time  BP 166/72 10/27/24 12:27  Temp 36.5 C 10/27/24 12:27  Pulse 63 10/27/24 12:27  Resp 12 10/27/24 12:27  SpO2 99 % 10/27/24 12:27    Last Pain:  Vitals:   10/27/24 1227  TempSrc: Oral  PainSc: 0-No pain      Patients Stated Pain Goal: 5 (10/27/24 1129)  Complications: No notable events documented.

## 2024-10-27 NOTE — Discharge Instructions (Addendum)
 Please discharge patient when stable, will follow up today with Dr. June Leap at the [facility] office immediately following discharge.  Leave shield in place until visit.  All paperwork with discharge instructions will be given at the office.  [facility] Address:  [address]  [place], [place] [address]

## 2024-10-27 NOTE — Anesthesia Preprocedure Evaluation (Signed)
 Anesthesia Evaluation  Patient identified by MRN, date of birth, ID band Patient awake    Reviewed: Allergy & Precautions, H&P , NPO status , Patient's Chart, lab work & pertinent test results, reviewed documented beta blocker date and time   Airway Mallampati: II  TM Distance: >3 FB Neck ROM: full    Dental no notable dental hx.    Pulmonary neg pulmonary ROS, former smoker   Pulmonary exam normal breath sounds clear to auscultation       Cardiovascular Exercise Tolerance: Good hypertension, negative cardio ROS  Rhythm:regular Rate:Normal     Neuro/Psych negative neurological ROS  negative psych ROS   GI/Hepatic negative GI ROS, Neg liver ROS,,,  Endo/Other  negative endocrine ROS    Renal/GU negative Renal ROS  negative genitourinary   Musculoskeletal   Abdominal   Peds  Hematology negative hematology ROS (+)   Anesthesia Other Findings   Reproductive/Obstetrics negative OB ROS                              Anesthesia Physical Anesthesia Plan  ASA: 2  Anesthesia Plan: MAC   Post-op Pain Management:    Induction:   PONV Risk Score and Plan: Propofol  infusion  Airway Management Planned:   Additional Equipment:   Intra-op Plan:   Post-operative Plan:   Informed Consent: I have reviewed the patients History and Physical, chart, labs and discussed the procedure including the risks, benefits and alternatives for the proposed anesthesia with the patient or authorized representative who has indicated his/her understanding and acceptance.     Dental Advisory Given  Plan Discussed with: CRNA  Anesthesia Plan Comments:         Anesthesia Quick Evaluation

## 2024-11-03 ENCOUNTER — Encounter (HOSPITAL_COMMUNITY): Payer: Self-pay | Admitting: Ophthalmology

## 2025-03-28 ENCOUNTER — Ambulatory Visit: Admitting: Urology
